# Patient Record
Sex: Female | Born: 1990 | Race: Black or African American | Hispanic: No | Marital: Single | Smoking: Never smoker
Health system: Southern US, Community
[De-identification: ages and names within clinical notes are randomized; demographics above are authoritative.]

## PROBLEM LIST (undated history)

## (undated) DIAGNOSIS — L309 Dermatitis, unspecified: Secondary | ICD-10-CM

## (undated) DIAGNOSIS — R87619 Unspecified abnormal cytological findings in specimens from cervix uteri: Secondary | ICD-10-CM

## (undated) DIAGNOSIS — A64 Unspecified sexually transmitted disease: Secondary | ICD-10-CM

## (undated) DIAGNOSIS — J45909 Unspecified asthma, uncomplicated: Secondary | ICD-10-CM

## (undated) DIAGNOSIS — Z8489 Family history of other specified conditions: Secondary | ICD-10-CM

## (undated) DIAGNOSIS — G43909 Migraine, unspecified, not intractable, without status migrainosus: Secondary | ICD-10-CM

## (undated) DIAGNOSIS — N83209 Unspecified ovarian cyst, unspecified side: Secondary | ICD-10-CM

## (undated) DIAGNOSIS — O1490 Unspecified pre-eclampsia, unspecified trimester: Secondary | ICD-10-CM

## (undated) HISTORY — DX: Unspecified pre-eclampsia, unspecified trimester: O14.90

## (undated) HISTORY — PX: NO PAST SURGERIES: SHX2092

## (undated) HISTORY — DX: Unspecified abnormal cytological findings in specimens from cervix uteri: R87.619

## (undated) HISTORY — DX: Unspecified sexually transmitted disease: A64

## (undated) HISTORY — DX: Migraine, unspecified, not intractable, without status migrainosus: G43.909

## (undated) HISTORY — DX: Family history of other specified conditions: Z84.89

---

## 2018-12-21 ENCOUNTER — Emergency Department (HOSPITAL_COMMUNITY): Payer: Commercial Managed Care - PPO

## 2018-12-21 ENCOUNTER — Emergency Department (HOSPITAL_COMMUNITY)
Admission: EM | Admit: 2018-12-21 | Discharge: 2018-12-21 | Disposition: A | Discharge status: Home / Self Care | Payer: Commercial Managed Care - PPO | Attending: Emergency Medicine | Admitting: Emergency Medicine

## 2018-12-21 ENCOUNTER — Other Ambulatory Visit: Payer: Self-pay

## 2018-12-21 ENCOUNTER — Encounter (HOSPITAL_COMMUNITY): Payer: Self-pay | Admitting: Emergency Medicine

## 2018-12-21 DIAGNOSIS — R1032 Left lower quadrant pain: Secondary | ICD-10-CM | POA: Diagnosis not present

## 2018-12-21 HISTORY — DX: Unspecified ovarian cyst, unspecified side: N83.209

## 2018-12-21 HISTORY — DX: Unspecified asthma, uncomplicated: J45.909

## 2018-12-21 LAB — URINALYSIS, ROUTINE W REFLEX MICROSCOPIC
Bilirubin Urine: NEGATIVE
Glucose, UA: NEGATIVE mg/dL
Ketones, ur: NEGATIVE mg/dL
Nitrite: NEGATIVE
Protein, ur: NEGATIVE mg/dL
Specific Gravity, Urine: 1.023 (ref 1.005–1.030)
pH: 7 (ref 5.0–8.0)

## 2018-12-21 LAB — BASIC METABOLIC PANEL
Anion gap: 9 (ref 5–15)
BUN: 8 mg/dL (ref 6–20)
CO2: 22 mmol/L (ref 22–32)
Calcium: 9.1 mg/dL (ref 8.9–10.3)
Chloride: 108 mmol/L (ref 98–111)
Creatinine, Ser: 0.9 mg/dL (ref 0.44–1.00)
GFR calc Af Amer: >60 mL/min (ref >60)
GFR calc non Af Amer: >60 mL/min (ref >60)
Glucose, Bld: 97 mg/dL (ref 70–99)
Potassium: 3.8 mmol/L (ref 3.5–5.1)
Sodium: 139 mmol/L (ref 135–145)

## 2018-12-21 LAB — CBC
HCT: 42.8 % (ref 36.0–46.0)
Hemoglobin: 13.2 g/dL (ref 12.0–15.0)
MCH: 27.4 pg (ref 26.0–34.0)
MCHC: 30.8 g/dL (ref 30.0–36.0)
MCV: 89 fL (ref 80.0–100.0)
Platelets: 322 10*3/uL (ref 150–400)
RBC: 4.81 MIL/uL (ref 3.87–5.11)
RDW: 12.6 % (ref 11.5–15.5)
WBC: 5.9 10*3/uL (ref 4.0–10.5)
nRBC: 0 % (ref 0.0–0.2)

## 2018-12-21 LAB — I-STAT BETA HCG BLOOD, ED (MC, WL, AP ONLY): I-stat hCG, quantitative: <5 m[IU]/mL (ref <5)

## 2018-12-21 MED ORDER — SULFAMETHOXAZOLE-TRIMETHOPRIM 800-160 MG PO TABS
1.0000 | ORAL_TABLET | Freq: Once | ORAL | Status: AC
  Administered 2018-12-21: 09:00:00 1 via ORAL
  Filled 2018-12-21: qty 1

## 2018-12-21 MED ORDER — SULFAMETHOXAZOLE-TRIMETHOPRIM 800-160 MG PO TABS: 1.0000 | ORAL_TABLET | Freq: Two times a day (BID) | ORAL | 0 refills | Status: AC

## 2018-12-21 NOTE — ED Triage Notes (Signed)
Pt reports history of ovarian cyst.  C/o L sided abd pain since Monday.  Denies nausea, vomiting, and diarrhea.

## 2018-12-21 NOTE — Discharge Instructions (Signed)
Thank you for allowing me to care for you today. Please return to the emergency department if you have new or worsening symptoms. Take your medications as instructed.  ° °

## 2018-12-21 NOTE — ED Provider Notes (Signed)
[facility] EMERGENCY DEPARTMENT Provider Note   CSN: [id number] Arrival date & time: 12/21/18  0541     History   Chief Complaint Chief Complaint  Patient presents with  . L sided abd pain    HPI Patty Coffey is a 28 y.o. female.     Patient is a 28 year old female with past medical history of ovarian cyst presents emergency department for left-sided abdominal pain.  Reports that this started on Monday.  Reports that on Monday it was severe 10 out of 10 but today has subsided.  Reports that her mother wanted to come and get checked out.  She has a history of a ovarian cyst on the right side.  Reports that she was afraid that it may have ruptured.  She is sexually active.  She denies any vaginal bleeding, vaginal discharge, nausea, vomiting, fever, diarrhea, chills, hematuria.  She does not have any concern for sexually transmitted infections.  She reports currently the pain is controlled.  No exacerbating or relieving factors.     Past Medical History:  Diagnosis Date  . Asthma   . Ovarian cyst     There are no active problems to display for this patient.   History reviewed. No pertinent surgical history.   OB History   No obstetric history on file.      Home Medications    Prior to Admission medications   Medication Sig Start Date End Date Taking? Authorizing Provider  sulfamethoxazole-trimethoprim (BACTRIM DS) 800-160 MG tablet Take 1 tablet by mouth 2 (two) times daily for 5 days. 12/21/18 12/26/18  Arlyn Dunning, PA-C    Family History No family history on file.  Social History Social History   Tobacco Use  . Smoking status: Never Smoker  . Smokeless tobacco: Never Used  Substance Use Topics  . Alcohol use: Yes  . Drug use: Never     Allergies   Toradol [ketorolac tromethamine]   Review of Systems Review of Systems  Constitutional: Negative for appetite change, chills and fever.  HENT: Negative for congestion.    Respiratory: Negative for cough and shortness of breath.   Cardiovascular: Negative for chest pain.  Gastrointestinal: Positive for abdominal pain. Negative for abdominal distention, anal bleeding, blood in stool, constipation, diarrhea, nausea and vomiting.  Genitourinary: Negative for difficulty urinating, dysuria, frequency, hematuria, menstrual problem, pelvic pain, urgency, vaginal bleeding and vaginal discharge.  Musculoskeletal: Negative for back pain.  Skin: Negative for rash.  Neurological: Negative for dizziness and headaches.     Physical Exam Updated Vital Signs BP 129/72 (BP Location: Right Arm)   Pulse 80   Temp 98.4 F (36.9 C) (Oral)   Resp 16   LMP 12/10/2018   SpO2 100%   Physical Exam Vitals signs and nursing note reviewed.  Constitutional:      General: She is not in acute distress.    Appearance: Normal appearance. She is well-developed.  HENT:     Head: Normocephalic and atraumatic.     Mouth/Throat:     Mouth: Mucous membranes are moist.  Eyes:     Conjunctiva/sclera: Conjunctivae normal.  Neck:     Musculoskeletal: Neck supple.  Cardiovascular:     Rate and Rhythm: Normal rate and regular rhythm.     Heart sounds: No murmur.  Pulmonary:     Effort: Pulmonary effort is normal. No respiratory distress.     Breath sounds: Normal breath sounds.  Abdominal:     General: Abdomen is flat.  Palpations: Abdomen is soft.     Tenderness: There is abdominal tenderness. There is no right CVA tenderness, left CVA tenderness, guarding or rebound. Negative signs include Murphy's sign and Rovsing's sign.     Hernia: No hernia is present.    Skin:    General: Skin is warm and dry.  Neurological:     Mental Status: She is alert.      ED Treatments / Results  Labs (all labs ordered are listed, but only abnormal results are displayed) Labs Reviewed  URINALYSIS, ROUTINE W REFLEX MICROSCOPIC - Abnormal; Notable for the following components:      Result  Value   APPearance HAZY (*)    Hgb urine dipstick SMALL (*)    Leukocytes,Ua TRACE (*)    Bacteria, UA FEW (*)    All other components within normal limits  URINE CULTURE  CBC  BASIC METABOLIC PANEL  I-STAT BETA HCG BLOOD, ED ([facility], [facility], [facility] ONLY)    EKG None  Radiology Ct Renal Stone Study  Result Date: 12/21/2018 CLINICAL DATA:  Hematuria.  Left abdominal pain. EXAM: CT ABDOMEN AND PELVIS WITHOUT CONTRAST TECHNIQUE: Multidetector CT imaging of the abdomen and pelvis was performed following the standard protocol without IV contrast. COMPARISON:  None. FINDINGS: Lower chest: No significant pulmonary nodules or acute consolidative airspace disease. Hepatobiliary: Normal liver size. No liver mass. Normal gallbladder with no radiopaque cholelithiasis. No biliary ductal dilatation. Pancreas: Normal, with no mass or duct dilation. Spleen: Normal size. No mass. Adrenals/Urinary Tract: Normal adrenals. No renal stones. No hydronephrosis. No contour deforming renal masses. Normal caliber ureters. No ureteral stones. Normal bladder with no bladder stones. Stomach/Bowel: Normal non-distended stomach. Normal caliber small bowel with no small bowel wall thickening. Normal appendix. Normal large bowel with no diverticulosis, large bowel wall thickening or pericolonic fat stranding. Vascular/Lymphatic: Normal caliber abdominal aorta. No pathologically enlarged lymph nodes in the abdomen or pelvis. Reproductive: Grossly normal uterus.  No adnexal mass. Other: No pneumoperitoneum, ascites or focal fluid collection. Musculoskeletal: No aggressive appearing focal osseous lesions. IMPRESSION: No acute abnormality. No urolithiasis. No hydronephrosis. No evidence of bowel obstruction or acute bowel inflammation. Electronically Signed   By: Ilona Sorrel M.D.   On: 12/21/2018 10:24    Procedures Procedures (including critical care time)  Medications Ordered in ED Medications  sulfamethoxazole-trimethoprim (BACTRIM  DS) 800-160 MG per tablet 1 tablet (1 tablet Oral Given 12/21/18 0837)     Initial Impression / Assessment and Plan / ED Course  I have reviewed the triage vital signs and the nursing notes.  Pertinent labs & imaging results that were available during my care of the patient were reviewed by me and considered in my medical decision making (see chart for details).  Clinical Course as of Dec 21 1026  Wed Dec 21, 2018  1610 Patient with improving left side abdominal pain since Monday. She is concerned because she has an ovarian cyst on that side. No pelvic or vaginal complaints, no peritoneal signs on exam.  The location of her pain on my exam is not consistent with an ovarian cyst.  She does have some hemoglobin and red blood cells in her urine.  For this reason and for the location of her pain I am going to get a CT renal study to rule out a stone.  Otherwise she is afebrile, normal vitals, pain controlled, reassuring labs.   [KM]  1028 Pain is controlled, work-up negative.  Plan to discharge patient on Bactrim for UTI.  Discussed return precautions.  Discussed plan with Dr. Stevie Kernykstra and plan agreed upon.   [KM]    Clinical Course User Index [KM] Arlyn DunningMcLean, Laurell Coalson A, PA-C       Based on review of vitals, medical screening exam, lab work and/or imaging, there does not appear to be an acute, emergent etiology for the patient's symptoms. Counseled pt on good return precautions and encouraged both PCP and ED follow-up as needed.  Prior to discharge, I also discussed incidental imaging findings with patient in detail and advised appropriate, recommended follow-up in detail.  Clinical Impression: 1. Left lower quadrant abdominal pain     Disposition: Discharge  Prior to providing a prescription for a controlled substance, I independently reviewed the patient's recent prescription history on the [place] Controlled Substance Reporting System. The patient had no recent or regular prescriptions  and was deemed appropriate for a brief, less than 3 day prescription of narcotic for acute analgesia.  This note was prepared with assistance of Conservation officer, historic buildingsDragon voice recognition software. Occasional wrong-word or sound-a-like substitutions may have occurred due to the inherent limitations of voice recognition software.   Final Clinical Impressions(s) / ED Diagnoses   Final diagnoses:  Left lower quadrant abdominal pain    ED Discharge Orders         Ordered    sulfamethoxazole-trimethoprim (BACTRIM DS) 800-160 MG tablet  2 times daily     12/21/18 1027           Jeral PinchMcLean, Myishia Kasik A, PA-C 12/21/18 1029    Milagros Lollykstra, Richard S, MD 12/21/18 1737

## 2018-12-21 NOTE — ED Notes (Signed)
Spoke with Antonietta Breach, PA regarding lab orders.  Orders received.

## 2018-12-23 LAB — URINE CULTURE: Culture: >=100000 — AB

## 2019-01-27 ENCOUNTER — Encounter: Payer: Self-pay | Admitting: Certified Nurse Midwife

## 2019-02-17 ENCOUNTER — Other Ambulatory Visit (HOSPITAL_COMMUNITY)
Admission: RE | Admit: 2019-02-17 | Discharge: 2019-02-17 | Disposition: A | Discharge status: Home / Self Care | Payer: Commercial Managed Care - PPO | Source: Ambulatory Visit | Attending: Obstetrics & Gynecology | Admitting: Obstetrics & Gynecology

## 2019-02-17 ENCOUNTER — Ambulatory Visit: Payer: Commercial Managed Care - PPO | Admitting: Certified Nurse Midwife

## 2019-02-17 ENCOUNTER — Other Ambulatory Visit: Payer: Self-pay

## 2019-02-17 ENCOUNTER — Encounter: Payer: Self-pay | Admitting: Certified Nurse Midwife

## 2019-02-17 VITALS — BP 120/78 | HR 70 | Temp 97.1°F | Resp 16 | Ht 65.75 in | Wt 229.0 lb

## 2019-02-17 DIAGNOSIS — Z124 Encounter for screening for malignant neoplasm of cervix: Secondary | ICD-10-CM | POA: Insufficient documentation

## 2019-02-17 DIAGNOSIS — Z113 Encounter for screening for infections with a predominantly sexual mode of transmission: Secondary | ICD-10-CM

## 2019-02-17 DIAGNOSIS — Z01419 Encounter for gynecological examination (general) (routine) without abnormal findings: Secondary | ICD-10-CM | POA: Diagnosis not present

## 2019-02-17 NOTE — Progress Notes (Signed)
28 y.o. G1P0010 Single  African American Fe here for annual exam. Periods monthly, duration 4 days, day 2-3 is heavy with cramping. Uses heating pad and Advil use with good results. Desires STD screening, partner change in the last 3 months. Contraception use of Nexplanon 6 years. Condom use sporadic. 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 No other health issues today.   Patient's last menstrual period was 01/26/2019 (exact date).          Sexually active: Yes.    The current method of family planning is none.  Exercising: Yes.    at work Smoker:  no  Review of Systems  Constitutional: Negative.   HENT: Negative.   Eyes: Negative.   Respiratory: Negative.   Cardiovascular: Negative.   Gastrointestinal: Negative.   Genitourinary: Negative.   Musculoskeletal: Negative.   Skin: Negative.   Neurological: Negative.   Endo/Heme/Allergies: Negative.   Psychiatric/Behavioral: Negative.     Health Maintenance: Pap:  2019 neg per patient History of Abnormal Pap: age 59 MMG:  none Self Breast exams: yes Colonoscopy:  none BMD:   none TDaP:  unsure Shingles: no Pneumonia: not done Hep C and HIV: neg per patient  Labs: yes   reports that she has never smoked. She has never used smokeless tobacco. She reports previous alcohol use. She reports that she does not use drugs.  Past Medical History:  Diagnosis Date  . Abnormal Pap smear of cervix   . Asthma   . Migraines   . Ovarian cyst   . Ovarian cyst    left side    No past surgical history on file.  No current outpatient medications on file.   No current facility-administered medications for this visit.     Family History  Problem Relation Age of Onset  . Bone cancer Mother   . Diabetes Mother   . Ovarian cancer Maternal Aunt   . Diabetes Maternal Aunt   . Hypertension Maternal Aunt   . Stroke Maternal Grandmother   . Diabetes Maternal Grandmother   . Hypertension Maternal Grandmother   . Heart attack  Maternal Grandmother   . Breast cancer Maternal Aunt   . Diabetes Maternal Aunt   . Hypertension Maternal Aunt   . Stroke Maternal Aunt   . Breast cancer Maternal Aunt   . Hypertension Maternal Aunt   . Stroke Maternal Aunt     ROS:  Pertinent items are noted in HPI.  Otherwise, a comprehensive ROS was negative.  Exam:   BP 120/78   Pulse 70   Temp (!) 97.1 F (36.2 C) (Skin)   Resp 16   Ht 5' 5.75" (1.67 m)   Wt 229 lb (103.9 kg)   LMP 01/26/2019 (Exact Date)   BMI 37.24 kg/m  Height: 5' 5.75" (167 cm) Ht Readings from Last 3 Encounters:  02/17/19 5' 5.75" (1.67 m)    General appearance: alert, cooperative and appears stated age Head: Normocephalic, without obvious abnormality, atraumatic Neck: no adenopathy, supple, symmetrical, trachea midline and thyroid normal to inspection and palpation Lungs: clear to auscultation bilaterally Breasts: normal appearance, no masses or tenderness, No nipple retraction or dimpling, No nipple discharge or bleeding, No axillary or supraclavicular adenopathy Heart: regular rate and rhythm Abdomen: soft, non-tender; no masses,  no organomegaly Extremities: extremities normal, atraumatic, no cyanosis or edema Skin: Skin color, texture, turgor normal. No rashes or lesions Lymph nodes: Cervical, supraclavicular, and axillary nodes normal. No abnormal inguinal nodes palpated Neurologic: Grossly normal  Pelvic: External genitalia:  no lesions              Urethra:  normal appearing urethra with no masses, tenderness or lesions              Bartholin's and Skene's: normal                 Vagina: normal appearing vagina with normal color and discharge, no lesions              Cervix: no cervical motion tenderness, no lesions and nulliparous appearance              Pap taken: Yes.   Bimanual Exam:  Uterus:  normal size, contour, position, consistency, mobility, non-tender              Adnexa: normal adnexa and no mass, fullness, tenderness                Rectovaginal: Confirms               Anus:  normal appearance no lesions  Chaperone present: yes  A:  Well Woman with normal exam  Contraception condoms previous Nexplanon   STD screening/screening labs   P:   Reviewed health and wellness pertinent to exam  Discussed importance of contraception, desires none at this point. Has used condoms also.  Labs: HIV RPR, HEP C, affirm, GC/Chlamydia  Pap smear:    counseled on breast self exam, STD prevention, HIV risk factors and prevention, feminine hygiene, family planning choices, adequate intake of calcium and vitamin D, diet and exercise  return annually or prn  An After Visit Summary was printed and given to the patient.

## 2019-02-17 NOTE — Patient Instructions (Signed)
General topics  Next pap or exam is  due in 1 year Take a Women's multivitamin Take 1200 mg. of calcium daily - prefer dietary If any concerns in interim to call back  Breast Self-Awareness Practicing breast self-awareness may pick up problems early, prevent significant medical complications, and possibly save your life. By practicing breast self-awareness, you can become familiar with how your breasts look and feel and if your breasts are changing. This allows you to notice changes early. It can also offer you some reassurance that your breast health is good. One way to learn what is normal for your breasts and whether your breasts are changing is to do a breast self-exam. If you find a lump or something that was not present in the past, it is best to contact your caregiver right away. Other findings that should be evaluated by your caregiver include nipple discharge, especially if it is bloody; skin changes or reddening; areas where the skin seems to be pulled in (retracted); or new lumps and bumps. Breast pain is seldom associated with cancer (malignancy), but should also be evaluated by a caregiver. BREAST SELF-EXAM The best time to examine your breasts is 5 7 days after your menstrual period is over.  ExitCare Patient Information 2013 [place].   Exercise to Stay Healthy Exercise helps you become and stay healthy. EXERCISE IDEAS AND TIPS Choose exercises that:  You enjoy.  Fit into your day. You do not need to exercise really hard to be healthy. You can do exercises at a slow or medium level and stay healthy. You can:  Stretch before and after working out.  Try yoga, Pilates, or tai chi.  Lift weights.  Walk fast, swim, jog, run, climb stairs, bicycle, dance, or rollerskate.  Take aerobic classes. Exercises that burn about 150 calories:  Running 1  miles in 15 minutes.  Playing volleyball for 45 to 60 minutes.  Washing and waxing a car for 45 to 60  minutes.  Playing touch football for 45 minutes.  Walking 1  miles in 35 minutes.  Pushing a stroller 1  miles in 30 minutes.  Playing basketball for 30 minutes.  Raking leaves for 30 minutes.  Bicycling 5 miles in 30 minutes.  Walking 2 miles in 30 minutes.  Dancing for 30 minutes.  Shoveling snow for 15 minutes.  Swimming laps for 20 minutes.  Walking up stairs for 15 minutes.  Bicycling 4 miles in 15 minutes.  Gardening for 30 to 45 minutes.  Jumping rope for 15 minutes.  Washing windows or floors for 45 to 60 minutes. Document Released: 04/04/2010 Document Revised: 05/25/2011 Document Reviewed: 04/04/2010 [facility] Patient Information 2013 [place].   Other topics ( that may be useful information):    Sexually Transmitted Disease Sexually transmitted disease (STD) refers to any infection that is passed from person to person during sexual activity. This may happen by way of saliva, semen, blood, vaginal mucus, or urine. Common STDs include:  Gonorrhea.  Chlamydia.  Syphilis.  HIV/AIDS.  Genital herpes.  Hepatitis B and C.  Trichomonas.  Human papillomavirus (HPV).  Pubic lice. CAUSES  An STD may be spread by bacteria, virus, or parasite. A person can get an STD by:  Sexual intercourse with an infected person.  Sharing sex toys with an infected person.  Sharing needles with an infected person.  Having intimate contact with the genitals, mouth, or rectal areas of an infected person. SYMPTOMS  Some people may  they can still pass the infection to others. Different STDs have different symptoms. Symptoms include: °· Painful or bloody urination. °· Pain in the pelvis, abdomen, vagina, anus, throat, or eyes. °· Skin rash, itching, irritation, growths, or sores (lesions). These usually occur in the genital or anal area. °· Abnormal vaginal discharge. °· Penile discharge in men. °· Soft, flesh-colored skin growths in the  genital or anal area. °· Fever. °· Pain or bleeding during sexual intercourse. °· Swollen glands in the groin area. °· Yellow skin and eyes (jaundice). This is seen with hepatitis. °DIAGNOSIS  °To make a diagnosis, your caregiver may: °· Take a medical history. °· Perform a physical exam. °· Take a specimen (culture) to be examined. °· Examine a sample of discharge under a microscope. °· Perform blood test °TREATMENT  °· Chlamydia, gonorrhea, trichomonas, and syphilis can be cured with antibiotic medicine. °· Genital herpes, hepatitis, and HIV can be treated, but not cured, with prescribed medicines. The medicines will lessen the symptoms. °· Genital warts from HPV can be treated with medicine or by freezing, burning (electrocautery), or surgery. Warts may come back. °· HPV is a virus and cannot be cured with medicine or surgery. However, abnormal areas may be followed very closely by your caregiver and may be removed from the cervix, vagina, or vulva through office procedures or surgery. °If your diagnosis is confirmed, your recent sexual partners need treatment. This is true even if they are symptom-free or have a negative culture or evaluation. They should not have sex until their caregiver says it is okay. °HOME CARE INSTRUCTIONS °· All sexual partners should be informed, tested, and treated for all STDs. °· Take your antibiotics as directed. Finish them even if you start to feel better. °· Only take over-the-counter or prescription medicines for pain, discomfort, or fever as directed by your caregiver. °· Rest. °· Eat a balanced diet and drink enough fluids to keep your urine clear or pale yellow. °· Do not have sex until treatment is completed and you have followed up with your caregiver. STDs should be checked after treatment. °· Keep all follow-up appointments, Pap tests, and blood tests as directed by your caregiver. °· Only use latex condoms and water-soluble lubricants during sexual activity. Do not use  petroleum jelly or oils. °· Avoid alcohol and illegal drugs. °· Get vaccinated for HPV and hepatitis. If you have not received these vaccines in the past, talk to your caregiver about whether one or both might be right for you. °· Avoid risky sex practices that can break the skin. °The only way to avoid getting an STD is to avoid all sexual activity. Latex condoms and dental dams (for oral sex) will help lessen the risk of getting an STD, but will not completely eliminate the risk. °SEEK MEDICAL CARE IF:  °· You have a fever. °· You have any new or worsening symptoms. °Document Released: 05/23/2002 Document Revised: 05/25/2011 Document Reviewed: 05/30/2010 °ExitCare® Patient Information ©2013 [employer]. ° ° ° °Domestic Abuse °You are being battered or abused if someone close to you hits, pushes, or physically hurts you in any way. You also are being abused if you are forced into activities. You are being sexually abused if you are forced to have sexual contact of any kind. You are being emotionally abused if you are made to feel worthless or if you are constantly threatened. It is important to remember that help is available. No one has the right to abuse you. °PREVENTION OF FURTHER   abuse you. PREVENTION OF FURTHER ABUSE  Learn the warning signs of danger. This varies with situations but may include: the use of alcohol, threats, isolation from friends and family, or forced sexual contact. Leave if you feel that violence is going to occur.  If you are attacked or beaten, report it to the police so the abuse is documented. You do not have to press charges. The police can protect you while you or the attackers are leaving. Get the officer's name and badge number and a copy of the report.  Find someone you can trust and tell them what is happening to you: your caregiver, a nurse, clergy member, close friend or family member. Feeling ashamed is natural, but remember that you have done nothing wrong. No one deserves abuse. Document Released:  02/28/2000 Document Revised: 05/25/2011 Document Reviewed: 05/08/2010 [facility] Patient Information 2013 [place].    How Much is Too Much Alcohol? Drinking too much alcohol can cause injury, accidents, and health problems. These types of problems can include:   Car crashes.  Falls.  Family fighting (domestic violence).  Drowning.  Fights.  Injuries.  Burns.  Damage to certain organs.  Having a baby with birth defects. ONE DRINK CAN BE TOO MUCH WHEN YOU ARE:  Working.  Pregnant or breastfeeding.  Taking medicines. Ask your doctor.  Driving or planning to drive. If you or someone you know has a drinking problem, get help from a doctor.  Document Released: 12/27/2008 Document Revised: 05/25/2011 Document Reviewed: 12/27/2008 [facility] Patient Information 2013 [place].   Smoking Hazards Smoking cigarettes is extremely bad for your health. Tobacco smoke has over 200 known poisons in it. There are over 60 chemicals in tobacco smoke that cause cancer. Some of the chemicals found in cigarette smoke include:   Cyanide.  Benzene.  Formaldehyde.  Methanol (wood alcohol).  Acetylene (fuel used in welding torches).  Ammonia. Cigarette smoke also contains the poisonous gases nitrogen oxide and carbon monoxide.  Cigarette smokers have an increased risk of many serious medical problems and Smoking causes approximately:  90% of all lung cancer deaths in men.  80% of all lung cancer deaths in women.  90% of deaths from chronic obstructive lung disease. Compared with nonsmokers, smoking increases the risk of:  Coronary heart disease by 2 to 4 times.  Stroke by 2 to 4 times.  Men developing lung cancer by 23 times.  Women developing lung cancer by 13 times.  Dying from chronic obstructive lung diseases by 12 times.  . Smoking is the most preventable cause of death and disease in our society.  WHY IS SMOKING ADDICTIVE?  Nicotine is the chemical  agent in tobacco that is capable of causing addiction or dependence.  When you smoke and inhale, nicotine is absorbed rapidly into the bloodstream through your lungs. Nicotine absorbed through the lungs is capable of creating a powerful addiction. Both inhaled and non-inhaled nicotine may be addictive.  Addiction studies of cigarettes and spit tobacco show that addiction to nicotine occurs mainly during the teen years, when young people begin using tobacco products. WHAT ARE THE BENEFITS OF QUITTING?  There are many health benefits to quitting smoking.   Likelihood of developing cancer and heart disease decreases. Health improvements are seen almost immediately.  Blood pressure, pulse rate, and breathing patterns start returning to normal soon after quitting. QUITTING SMOKING   American Lung Association - 1-800-LUNGUSA  American Cancer Society - 1-800-ACS-2345 Document Released: 04/09/2004 Document Revised: 05/25/2011 Document Reviewed: 12/12/2008  ExitCare Patient Information 2013 [place].   Stress Management Stress is a state of physical or mental tension that often results from changes in your life or normal routine. Some common causes of stress are:  Death of a loved one.  Injuries or severe illnesses.  Getting fired or changing jobs.  Moving into a new home. Other causes may be:  Sexual problems.  Business or financial losses.  Taking on a large debt.  Regular conflict with someone at home or at work.  Constant tiredness from lack of sleep. It is not just bad things that are stressful. It may be stressful to:  Win the lottery.  Get married.  Buy a new car. The amount of stress that can be easily tolerated varies from person to person. Changes generally cause stress, regardless of the types of change. Too much stress can affect your health. It may lead to physical or emotional problems. Too little stress (boredom) may also become stressful. SUGGESTIONS TO  REDUCE STRESS:  Talk things over with your family and friends. It often is helpful to share your concerns and worries. If you feel your problem is serious, you may want to get help from a professional counselor.  Consider your problems one at a time instead of lumping them all together. Trying to take care of everything at once may seem impossible. List all the things you need to do and then start with the most important one. Set a goal to accomplish 2 or 3 things each day. If you expect to do too many in a single day you will naturally fail, causing you to feel even more stressed.  Do not use alcohol or drugs to relieve stress. Although you may feel better for a short time, they do not remove the problems that caused the stress. They can also be habit forming.  Exercise regularly - at least 3 times per week. Physical exercise can help to relieve that "uptight" feeling and will relax you.  The shortest distance between despair and hope is often a good night's sleep.  Go to bed and get up on time allowing yourself time for appointments without being rushed.  Take a short "time-out" period from any stressful situation that occurs during the day. Close your eyes and take some deep breaths. Starting with the muscles in your face, tense them, hold it for a few seconds, then relax. Repeat this with the muscles in your neck, shoulders, hand, stomach, back and legs.  Take good care of yourself. Eat a balanced diet and get plenty of rest.  Schedule time for having fun. Take a break from your daily routine to relax. HOME CARE INSTRUCTIONS   Call if you feel overwhelmed by your problems and feel you can no longer manage them on your own.  Return immediately if you feel like hurting yourself or someone else. Document Released: 08/26/2000 Document Revised: 05/25/2011 Document Reviewed: 04/18/2007 [facility] Patient Information 2013 [place].

## 2019-02-18 LAB — VAGINITIS/VAGINOSIS, DNA PROBE
Candida Species: NEGATIVE
Gardnerella vaginalis: POSITIVE — AB
Trichomonas vaginosis: NEGATIVE

## 2019-02-18 LAB — HIV ANTIBODY (ROUTINE TESTING W REFLEX): HIV Screen 4th Generation wRfx: NONREACTIVE

## 2019-02-18 LAB — HEPATITIS C ANTIBODY: Hep C Virus Ab: <0.1 s/co ratio (ref 0.0–0.9)

## 2019-02-18 LAB — RPR: RPR Ser Ql: NONREACTIVE

## 2019-02-20 ENCOUNTER — Other Ambulatory Visit: Payer: Self-pay

## 2019-02-20 MED ORDER — METRONIDAZOLE 500 MG PO TABS: 500.0000 mg | ORAL_TABLET | Freq: Two times a day (BID) | ORAL | 0 refills | Status: DC

## 2019-02-21 LAB — CYTOLOGY - PAP
Chlamydia: NEGATIVE
Comment: NEGATIVE
Comment: NORMAL
Diagnosis: NEGATIVE
Neisseria Gonorrhea: NEGATIVE

## 2019-05-29 ENCOUNTER — Encounter: Payer: Self-pay | Admitting: Certified Nurse Midwife

## 2019-05-31 ENCOUNTER — Encounter: Payer: Self-pay | Admitting: Certified Nurse Midwife

## 2020-02-20 ENCOUNTER — Ambulatory Visit: Payer: Commercial Managed Care - PPO | Admitting: Certified Nurse Midwife

## 2020-02-20 NOTE — Progress Notes (Unsigned)
29 y.o. G8P0010 Single Black or [place] American female here for annual exam.   Period Pattern: Regular Menstrual Flow: Moderate Menstrual Control: Maxi pad Dysmenorrhea: (!) Moderate Dysmenorrhea Symptoms: Cramping   Reports concern of breast pain, nausea and sensitivities to smells (bleach). This started before her period, 11/28, lasted for 4 days before period came on and is still going on. Mostly improving now, just gets nauseous with smells.  Patient's last menstrual period was 02/15/2020 (exact date).   Not interested in birth control.  Recently diagnosed with trichomonas at an urgent care 11/28 Gonorrhea and Chlamydia negative. Everything feels normal now.           Sexually active: Yes.    The current method of family planning is condoms sometimes.    Exercising: Yes.    some walking Smoker:  no  Health Maintenance: Pap:  02-17-2019 neg History of abnormal Pap:  Age 22 MMG:  none Colonoscopy:  none BMD:   none TDaP:  2021 Gardasil:   completed Covid-19: moderna Pneumonia vaccine(s):  no Shingrix:   N/a  Hep C testing: neg 2020 Screening Labs: n/a   reports that she has never smoked. She has never used smokeless tobacco. She reports previous alcohol use. She reports that she does not use drugs.  Past Medical History:  Diagnosis Date  . Abnormal Pap smear of cervix   . Asthma   . Migraines   . Ovarian cyst   . Ovarian cyst    left side  . STD (sexually transmitted disease)    trichomonas treated    History reviewed. No pertinent surgical history.  No current outpatient medications on file.   No current facility-administered medications for this visit.    Family History  Problem Relation Age of Onset  . Bone cancer Mother   . Diabetes Mother   . Ovarian cancer Maternal Aunt   . Diabetes Maternal Aunt   . Hypertension Maternal Aunt   . Stroke Maternal Grandmother   . Diabetes Maternal Grandmother   . Hypertension Maternal Grandmother   . Heart attack  Maternal Grandmother   . Breast cancer Maternal Aunt   . Diabetes Maternal Aunt   . Hypertension Maternal Aunt   . Stroke Maternal Aunt   . Breast cancer Maternal Aunt   . Hypertension Maternal Aunt   . Stroke Maternal Aunt     Review of Systems  Exam:   Ht 5\' 6"  (1.676 m) Comment: hairstyle  Wt 218 lb (98.9 kg)   LMP 02/15/2020 (Exact Date)   BMI 35.19 kg/m   Height: 5\' 6"  (167.6 cm) (hairstyle)  General appearance: alert, cooperative and appears stated age Head: Normocephalic, without obvious abnormality, atraumatic Neck: no adenopathy, supple, symmetrical, trachea midline and thyroid normal to inspection and palpation Lungs: clear to auscultation bilaterally Breasts: normal appearance, no masses or tenderness Heart: regular rate and rhythm Abdomen: soft, non-tender; bowel sounds normal; no masses,  no organomegaly Extremities: extremities normal, atraumatic, no cyanosis or edema Skin: Skin color, texture, turgor normal. No rashes or lesions Lymph nodes: Cervical, supraclavicular, and axillary nodes normal. No abnormal inguinal nodes palpated Neurologic: Grossly normal   Pelvic: External genitalia:  no lesions              Urethra:  normal appearing urethra with no masses, tenderness or lesions              Bartholins and Skenes: normal  Vagina: normal appearing vagina with moderate to large amount white discharge, no lesions              Cervix: no cervical motion tenderness and no lesions              Pap taken: No. Bimanual Exam:  Uterus:  normal size, contour, position, consistency, mobility, non-tender              Adnexa: Right adnexa palpates slightly larger than left, non tender               Rectovaginal: Confirms               Anus:  normal sphincter tone, no lesions   Marchelle Folks, CMA Chaperone was present for exam.  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

## 2020-02-22 ENCOUNTER — Encounter: Payer: Self-pay | Admitting: Nurse Practitioner

## 2020-02-22 ENCOUNTER — Other Ambulatory Visit: Payer: Self-pay

## 2020-02-22 ENCOUNTER — Ambulatory Visit (INDEPENDENT_AMBULATORY_CARE_PROVIDER_SITE_OTHER): Payer: Commercial Managed Care - PPO | Admitting: Nurse Practitioner

## 2020-02-22 VITALS — BP 110/70 | HR 68 | Resp 16 | Ht 66.0 in | Wt 218.0 lb

## 2020-02-22 DIAGNOSIS — Z113 Encounter for screening for infections with a predominantly sexual mode of transmission: Secondary | ICD-10-CM | POA: Diagnosis not present

## 2020-02-22 DIAGNOSIS — N644 Mastodynia: Secondary | ICD-10-CM

## 2020-02-22 DIAGNOSIS — Z7721 Contact with and (suspected) exposure to potentially hazardous body fluids: Secondary | ICD-10-CM

## 2020-02-22 DIAGNOSIS — Z01419 Encounter for gynecological examination (general) (routine) without abnormal findings: Secondary | ICD-10-CM | POA: Diagnosis not present

## 2020-02-22 LAB — POCT URINE PREGNANCY: Preg Test, Ur: NEGATIVE

## 2020-02-22 NOTE — Patient Instructions (Signed)
Health Maintenance, Female Adopting a healthy lifestyle and getting preventive care are important in promoting health and wellness. Ask your health care provider about:  The right schedule for you to have regular tests and exams.  Things you can do on your own to prevent diseases and keep yourself healthy. What should I know about diet, weight, and exercise? Eat a healthy diet   Eat a diet that includes plenty of vegetables, fruits, low-fat dairy products, and lean protein.  Do not eat a lot of foods that are high in solid fats, added sugars, or sodium. Maintain a healthy weight Body mass index (BMI) is used to identify weight problems. It estimates body fat based on height and weight. Your health care provider can help determine your BMI and help you achieve or maintain a healthy weight. Get regular exercise Get regular exercise. This is one of the most important things you can do for your health. Most adults should:  Exercise for at least 150 minutes each week. The exercise should increase your heart rate and make you sweat (moderate-intensity exercise).  Do strengthening exercises at least twice a week. This is in addition to the moderate-intensity exercise.  Spend less time sitting. Even light physical activity can be beneficial. Watch cholesterol and blood lipids Have your blood tested for lipids and cholesterol at 28 years of age, then have this test every 5 years. Have your cholesterol levels checked more often if:  Your lipid or cholesterol levels are high.  You are older than 28 years of age.  You are at high risk for heart disease. What should I know about cancer screening? Depending on your health history and family history, you may need to have cancer screening at various ages. This may include screening for:  Breast cancer.  Cervical cancer.  Colorectal cancer.  Skin cancer.  Lung cancer. What should I know about heart disease, diabetes, and high blood  pressure? Blood pressure and heart disease  High blood pressure causes heart disease and increases the risk of stroke. This is more likely to develop in people who have high blood pressure readings, are of African descent, or are overweight.  Have your blood pressure checked: ? Every 3-5 years if you are 18-39 years of age. ? Every year if you are 40 years old or older. Diabetes Have regular diabetes screenings. This checks your fasting blood sugar level. Have the screening done:  Once every three years after age 40 if you are at a normal weight and have a low risk for diabetes.  More often and at a younger age if you are overweight or have a high risk for diabetes. What should I know about preventing infection? Hepatitis B If you have a higher risk for hepatitis B, you should be screened for this virus. Talk with your health care provider to find out if you are at risk for hepatitis B infection. Hepatitis C Testing is recommended for:  Everyone born from 1945 through 1965.  Anyone with known risk factors for hepatitis C. Sexually transmitted infections (STIs)  Get screened for STIs, including gonorrhea and chlamydia, if: ? You are sexually active and are younger than 28 years of age. ? You are older than 28 years of age and your health care provider tells you that you are at risk for this type of infection. ? Your sexual activity has changed since you were last screened, and you are at increased risk for chlamydia or gonorrhea. Ask your health care provider if   you are at risk.  Ask your health care provider about whether you are at high risk for HIV. Your health care provider may recommend a prescription medicine to help prevent HIV infection. If you choose to take medicine to prevent HIV, you should first get tested for HIV. You should then be tested every 3 months for as long as you are taking the medicine. Pregnancy  If you are about to stop having your period (premenopausal) and  you may become pregnant, seek counseling before you get pregnant.  Take 400 to 800 micrograms (mcg) of folic acid every day if you become pregnant.  Ask for birth control (contraception) if you want to prevent pregnancy. Osteoporosis and menopause Osteoporosis is a disease in which the bones lose minerals and strength with aging. This can result in bone fractures. If you are 67 years old or older, or if you are at risk for osteoporosis and fractures, ask your health care provider if you should:  Be screened for bone loss.  Take a calcium or vitamin D supplement to lower your risk of fractures.  Be given hormone replacement therapy (HRT) to treat symptoms of menopause. Follow these instructions at home: Lifestyle  Do not use any products that contain nicotine or tobacco, such as cigarettes, e-cigarettes, and chewing tobacco. If you need help quitting, ask your health care provider.  Do not use street drugs.  Do not share needles.  Ask your health care provider for help if you need support or information about quitting drugs. Alcohol use  Do not drink alcohol if: ? Your health care provider tells you not to drink. ? You are pregnant, may be pregnant, or are planning to become pregnant.  If you drink alcohol: ? Limit how much you use to 0-1 drink a day. ? Limit intake if you are breastfeeding.  Be aware of how much alcohol is in your drink. In the [place] one drink equals one 12 oz bottle of beer (355 mL), one 5 oz glass of wine (148 mL), or one 1 oz glass of hard liquor (44 mL). General instructions  Schedule regular health, dental, and eye exams.  Stay current with your vaccines.  Tell your health care provider if: ? You often feel depressed. ? You have ever been abused or do not feel safe at home. Summary  Adopting a healthy lifestyle and getting preventive care are important in promoting health and wellness.  Follow your health care provider's instructions about healthy  diet, exercising, and getting tested or screened for diseases.  Follow your health care provider's instructions on monitoring your cholesterol and blood pressure. This information is not intended to replace advice given to you by your health care provider. Make sure you discuss any questions you have with your health care provider. Document Revised: 02/23/2018 Document Reviewed: 02/23/2018 Elsevier Patient Education  2020 [employer].   Breast Tenderness Breast tenderness is a common problem for women of all ages, but may also occur in men. Breast tenderness may range from mild discomfort to severe pain. In women, the pain usually comes and goes with the menstrual cycle, but it can also be constant. Breast tenderness has many possible causes, including hormone changes, infections, and taking certain medicines. You may have tests, such as a mammogram or an ultrasound, to check for any unusual findings. Having breast tenderness usually does not mean that you have breast cancer. Follow these instructions at home: Managing pain and discomfort   If directed, put ice to the  the painful area. To do this: ? Put ice in a plastic bag. ? Place a towel between your skin and the bag. ? Leave the ice on for 20 minutes, 2-3 times a day.  Wear a supportive bra, especially during exercise. You may also want to wear a supportive bra while sleeping if your breasts are very tender. Medicines  Take over-the-counter and prescription medicines only as told by your health care provider. If the cause of your pain is infection, you may be prescribed an antibiotic medicine.  If you were prescribed an antibiotic, take it as told by your health care provider. Do not stop taking the antibiotic even if you start to feel better. Eating and drinking  Your health care provider may recommend that you lessen the amount of fat in your diet. You can do this by: ? Limiting fried foods. ? Cooking foods using methods such as  baking, boiling, grilling, and broiling.  Decrease the amount of caffeine in your diet. Instead, drink more water and choose caffeine-free drinks. General instructions   Keep a log of the days and times when your breasts are most tender.  Ask your health care provider how to do breast exams at home. This will help you notice if you have an unusual growth or lump.  Keep all follow-up visits as told by your health care provider. This is important. Contact a health care provider if:  Any part of your breast is hard, red, and hot to the touch. This may be a sign of infection.  You are a woman and: ? Not breastfeeding and you have fluid, especially blood or pus, coming out of your nipples. ? Have a new or painful lump in your breast that remains after your menstrual period ends.  You have a fever.  Your pain does not improve or it gets worse.  Your pain is interfering with your daily activities. Summary  Breast tenderness may range from mild discomfort to severe pain.  Breast tenderness has many possible causes, including hormone changes, infections, and taking certain medicines.  It can be treated with ice, wearing a supportive bra, and medicines.  Make changes to your diet if told to by your health care provider. This information is not intended to replace advice given to you by your health care provider. Make sure you discuss any questions you have with your health care provider. Document Revised: 07/25/2018 Document Reviewed: 07/25/2018 Elsevier Patient Education  2020 Elsevier Inc.  

## 2020-02-23 LAB — HEPATITIS C ANTIBODY: Hep C Virus Ab: <0.1 s/co ratio (ref 0.0–0.9)

## 2020-02-23 LAB — HIV ANTIBODY (ROUTINE TESTING W REFLEX): HIV Screen 4th Generation wRfx: NONREACTIVE

## 2020-02-23 LAB — RPR: RPR Ser Ql: NONREACTIVE

## 2020-02-24 LAB — VAGINITIS/VAGINOSIS, DNA PROBE
Candida Species: NEGATIVE
Gardnerella vaginalis: POSITIVE — AB
Trichomonas vaginosis: NEGATIVE

## 2020-02-26 ENCOUNTER — Other Ambulatory Visit: Payer: Self-pay | Admitting: Nurse Practitioner

## 2020-02-26 DIAGNOSIS — N760 Acute vaginitis: Secondary | ICD-10-CM

## 2020-02-26 MED ORDER — METRONIDAZOLE 500 MG PO TABS: 500.0000 mg | ORAL_TABLET | Freq: Two times a day (BID) | ORAL | 0 refills | Status: DC

## 2020-04-22 NOTE — Progress Notes (Signed)
GYNECOLOGY  VISIT  CC:   Re-screen STD and recheck pelvic exam  HPI: 30 y.o. G1P0010 Single Black or African American female here for std testing & pelvic exam.   Has a 23 day cycle. Having unprotected sex Having nipple tenderness, frequent urination Feels she is late on her cycle  GYNECOLOGIC HISTORY: Patient's last menstrual period was 04/01/2020 (exact date). Contraception: none Menopausal hormone therapy: none  There are no problems to display for this patient.   Past Medical History:  Diagnosis Date  . Abnormal Pap smear of cervix   . Asthma   . Migraines   . Ovarian cyst   . Ovarian cyst    left side  . STD (sexually transmitted disease)    trichomonas treated    History reviewed. No pertinent surgical history.  MEDS:   No current outpatient medications on file prior to visit.   No current facility-administered medications on file prior to visit.    ALLERGIES: Toradol [ketorolac tromethamine]  Family History  Problem Relation Age of Onset  . Bone cancer Mother   . Diabetes Mother   . Ovarian cancer Maternal Aunt   . Diabetes Maternal Aunt   . Hypertension Maternal Aunt   . Stroke Maternal Grandmother   . Diabetes Maternal Grandmother   . Hypertension Maternal Grandmother   . Heart attack Maternal Grandmother   . Breast cancer Maternal Aunt   . Diabetes Maternal Aunt   . Hypertension Maternal Aunt   . Stroke Maternal Aunt   . Breast cancer Maternal Aunt   . Hypertension Maternal Aunt   . Stroke Maternal Aunt     Review of Systems  Constitutional: Negative.   HENT: Negative.   Eyes: Negative.   Respiratory: Negative.   Cardiovascular: Negative.   Gastrointestinal: Negative.   Endocrine: Negative.   Genitourinary: Negative.   Musculoskeletal: Negative.   Skin: Negative.   Allergic/Immunologic: Negative.   Neurological: Negative.   Hematological: Negative.   Psychiatric/Behavioral: Negative.     PHYSICAL EXAMINATION:    BP 120/78    Pulse 70   Resp 16   Wt 221 lb (100.2 kg)   LMP 04/01/2020 (Exact Date)   BMI 35.67 kg/m     General appearance: alert, cooperative, no acute distress  Lymph:  no inguinal LAD noted  Pelvic: External genitalia:  no lesions              Urethra:  normal appearing urethra with no masses, tenderness or lesions              Bartholins and Skenes: normal                 Vagina: redness noted, moderate frothy discharge              Cervix: no cervical motion tenderness and no lesions              Bimanual Exam:  Uterus:  normal size, contour, position, consistency, mobility, non-tender              Adnexa: no mass, fullness, tenderness and bilaterally symetrical               Chaperone, Joy, CMA, was present for exam.  Assessment/Plan: Screen for STD (sexually transmitted disease) - Plan: SURESWAB CT/NG/T. vaginalis, WET PREP FOR TRICH, YEAST, CLUE  Irregular menstrual cycle - Plan: Pregnancy, urine  BV (bacterial vaginosis) - Plan: metroNIDAZOLE (FLAGYL) 500 MG tablet  Pelvic recheck: adnexa palpate bilaterally symmetrical  Encouraged multivitamin  with folic acid to prevent birth defects

## 2020-04-25 ENCOUNTER — Encounter: Payer: Self-pay | Admitting: Nurse Practitioner

## 2020-04-25 ENCOUNTER — Ambulatory Visit: Payer: Commercial Managed Care - PPO | Admitting: Nurse Practitioner

## 2020-04-25 ENCOUNTER — Other Ambulatory Visit: Payer: Self-pay

## 2020-04-25 VITALS — BP 120/78 | HR 70 | Resp 16 | Wt 221.0 lb

## 2020-04-25 DIAGNOSIS — B9689 Other specified bacterial agents as the cause of diseases classified elsewhere: Secondary | ICD-10-CM | POA: Diagnosis not present

## 2020-04-25 DIAGNOSIS — N926 Irregular menstruation, unspecified: Secondary | ICD-10-CM

## 2020-04-25 DIAGNOSIS — Z113 Encounter for screening for infections with a predominantly sexual mode of transmission: Secondary | ICD-10-CM

## 2020-04-25 DIAGNOSIS — N760 Acute vaginitis: Secondary | ICD-10-CM

## 2020-04-25 LAB — PREGNANCY, URINE: Preg Test, Ur: NEGATIVE

## 2020-04-25 LAB — WET PREP FOR TRICH, YEAST, CLUE

## 2020-04-25 MED ORDER — METRONIDAZOLE 500 MG PO TABS: 500.0000 mg | ORAL_TABLET | Freq: Two times a day (BID) | ORAL | 0 refills | Status: DC

## 2020-04-26 LAB — SURESWAB CT/NG/T. VAGINALIS
C. trachomatis RNA, TMA: NOT DETECTED
N. gonorrhoeae RNA, TMA: NOT DETECTED
Trichomonas vaginalis RNA: NOT DETECTED

## 2020-11-22 IMAGING — CT CT RENAL STONE PROTOCOL
2 of 4 series · 16 of 46 positions shown, 18 images · non-contrast
Comparison: None.

CLINICAL DATA: Hematuria.  Left abdominal pain.

EXAM:
CT ABDOMEN AND PELVIS WITHOUT CONTRAST
TECHNIQUE: Multidetector CT imaging of the abdomen and pelvis was performed
following the standard protocol without IV contrast.

[Series 3: renal stone 5.0 · axial · 0.81mm/px · z∈[+648,+1108]mm · 13 of 101 slices shown, 15 images]
[im 5/101  soft-tissue]
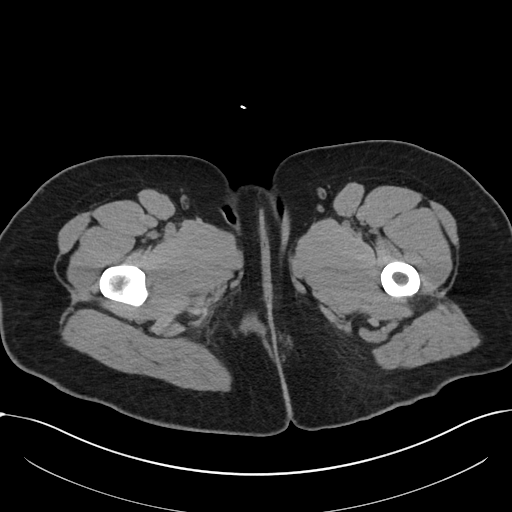
[im 5/101  bone]
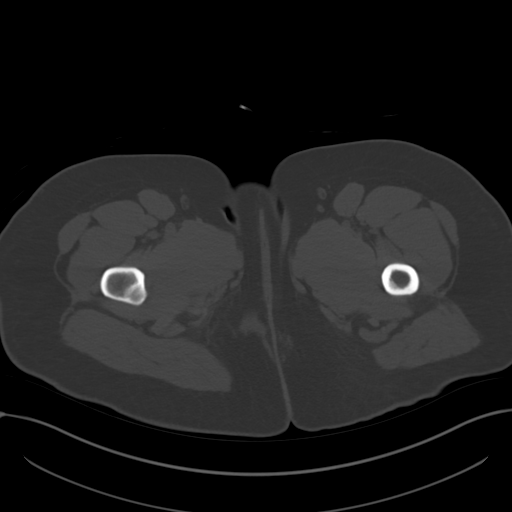
[im 13/101  soft-tissue]
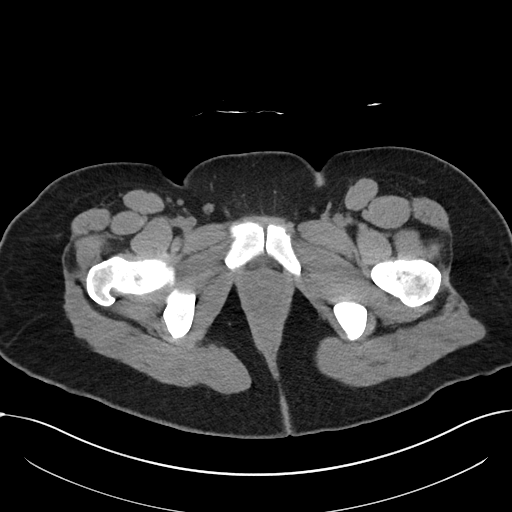
[im 21/101  soft-tissue]
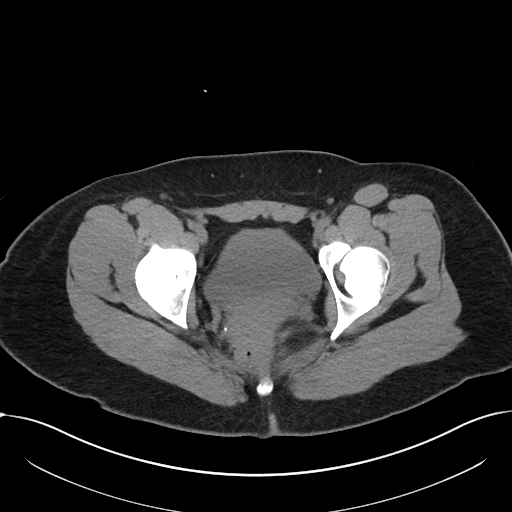
[im 29/101  soft-tissue]
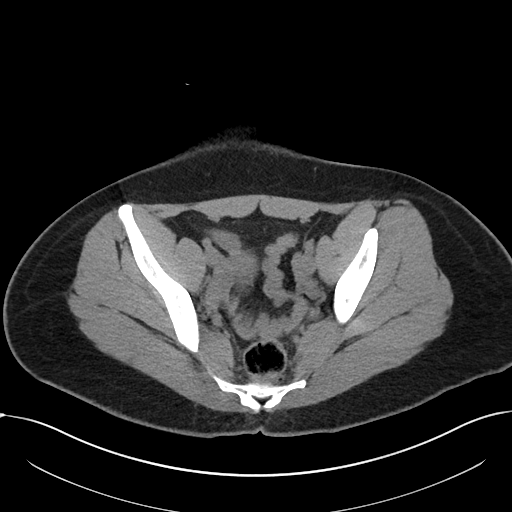
[im 37/101  soft-tissue]
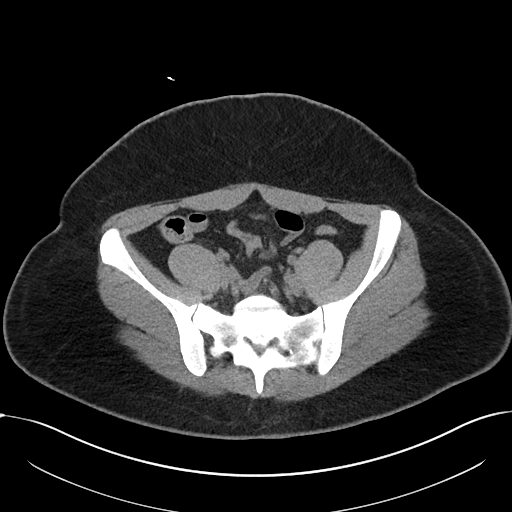
[im 45/101  soft-tissue]
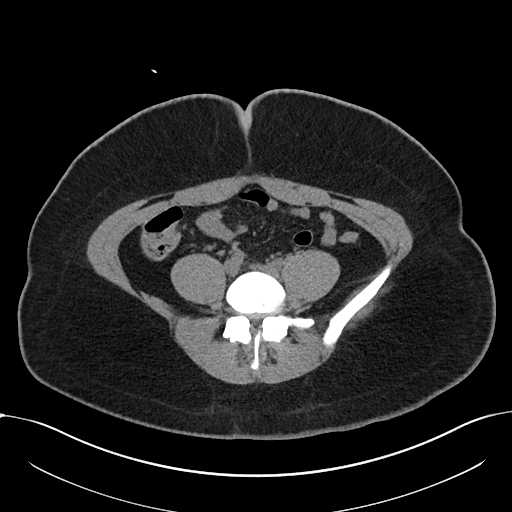
[im 53/101  soft-tissue]
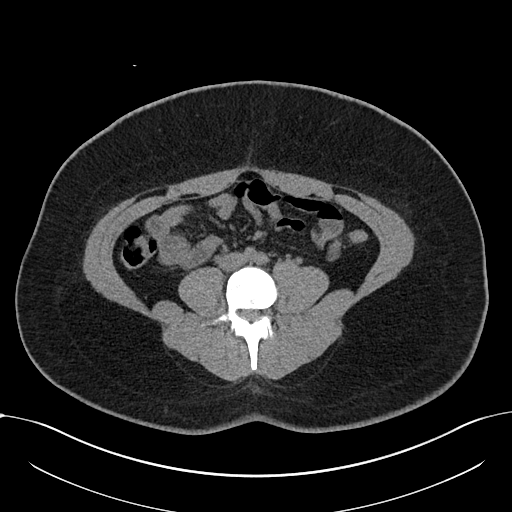
[im 57/101  soft-tissue]
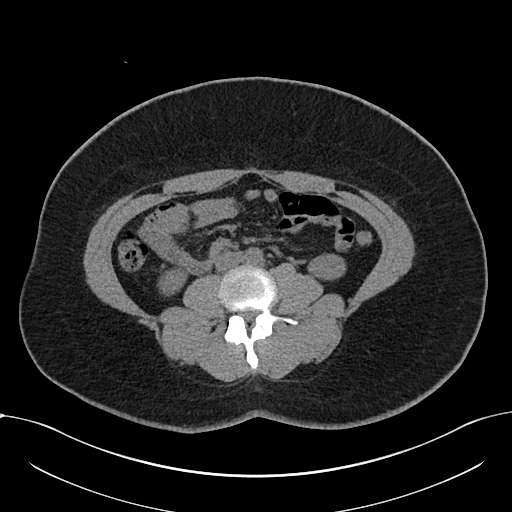
[im 65/101  soft-tissue]
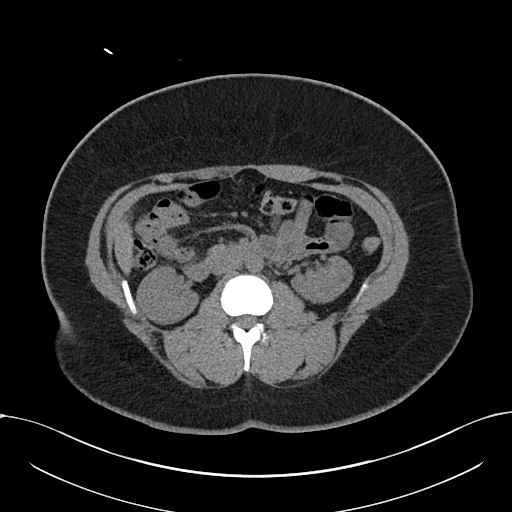
[im 65/101  bone]
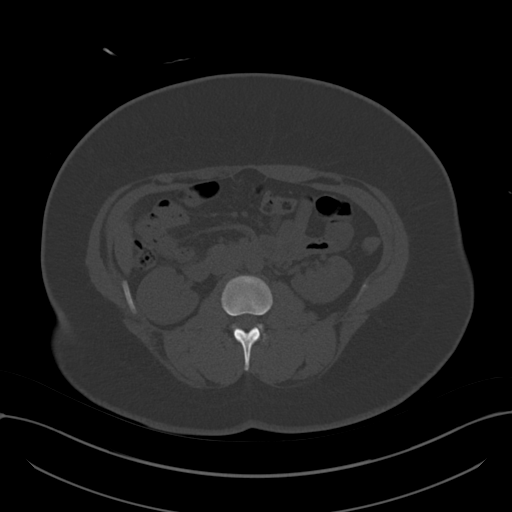
[im 73/101  soft-tissue]
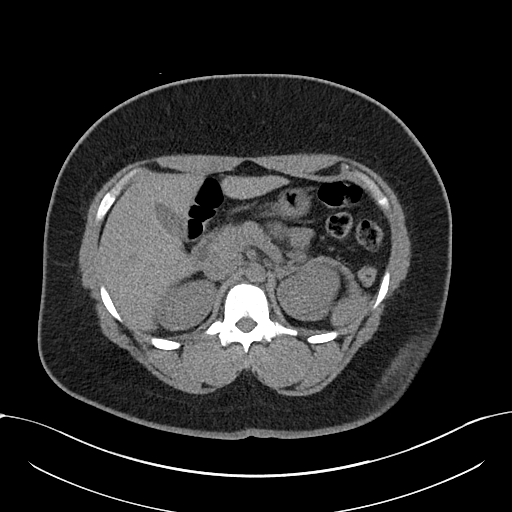
[im 81/101  soft-tissue]
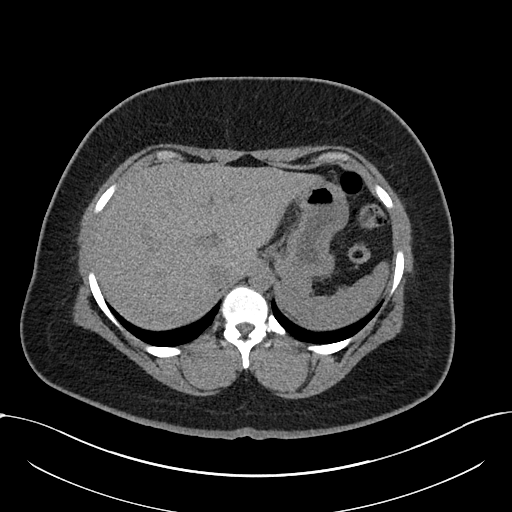
[im 89/101  soft-tissue]
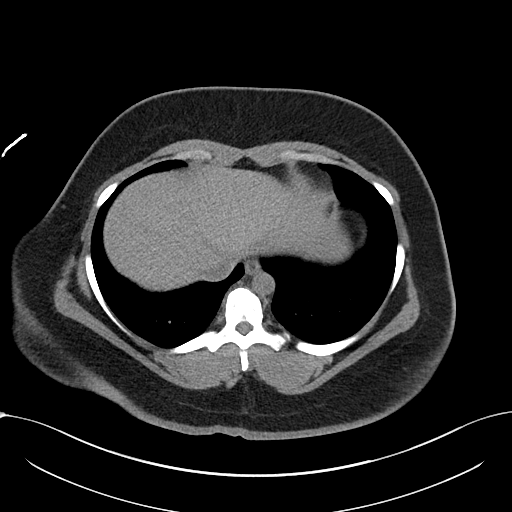
[im 97/101  soft-tissue]
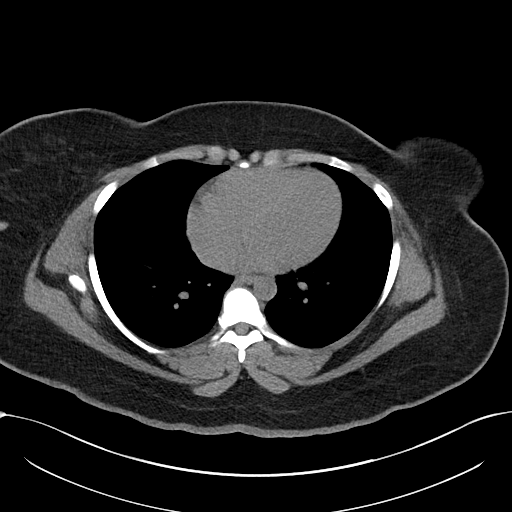

[Series 5: renal stone 3.0 cor · coronal · 0.76mm/px · 3 of 101 slices shown]
[im 34/101  soft-tissue]
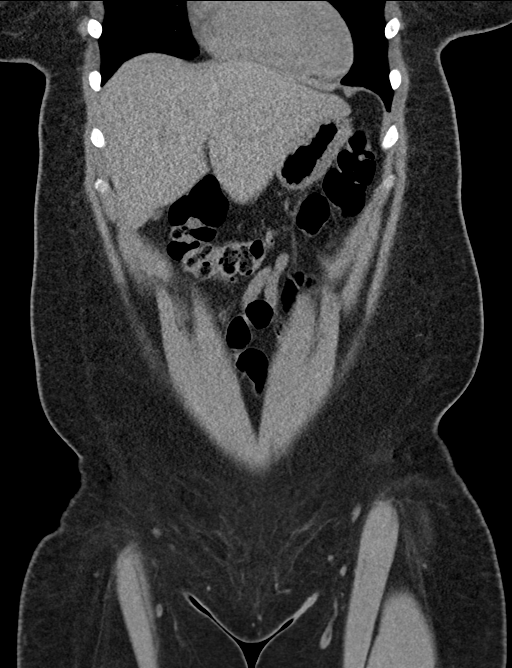
[im 45/101  soft-tissue]
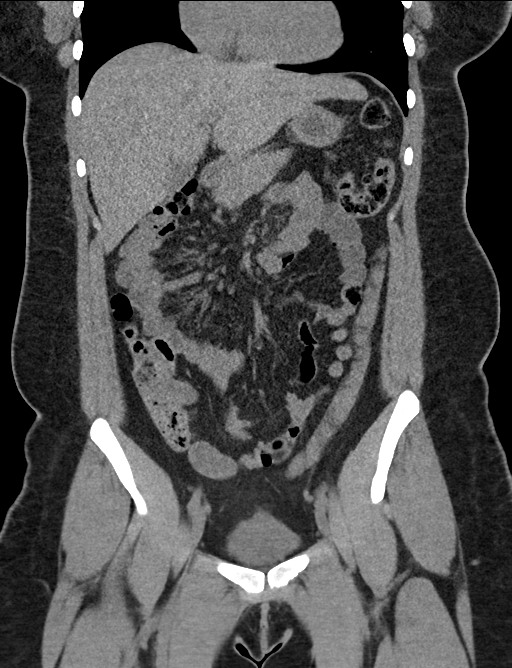
[im 56/101  soft-tissue]
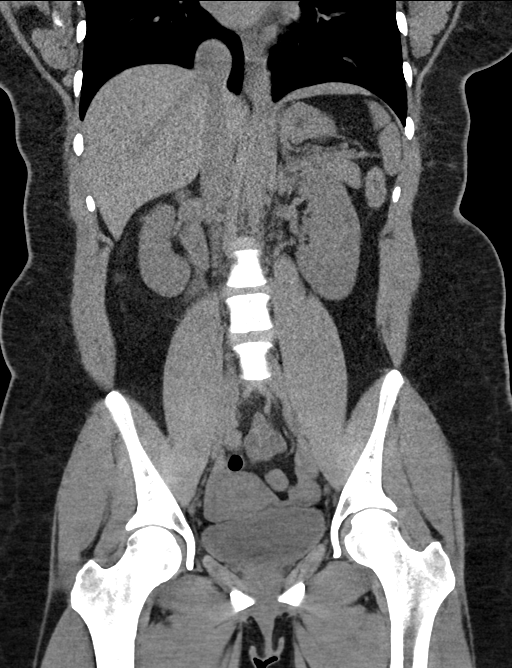

[16 of 46 positions shown; findings below may reference images not displayed]

FINDINGS: Lower chest: No significant pulmonary nodules or acute consolidative
airspace disease.

Hepatobiliary: Normal liver size. No liver mass. Normal gallbladder
with no radiopaque cholelithiasis. No biliary ductal dilatation.

Pancreas: Normal, with no mass or duct dilation.

Spleen: Normal size. No mass.

Adrenals/Urinary Tract: Normal adrenals. No renal stones. No
hydronephrosis. No contour deforming renal masses. Normal caliber
ureters. No ureteral stones. Normal bladder with no bladder stones.

Stomach/Bowel: Normal non-distended stomach. Normal caliber small
bowel with no small bowel wall thickening. Normal appendix. Normal
large bowel with no diverticulosis, large bowel wall thickening or
pericolonic fat stranding.

Vascular/Lymphatic: Normal caliber abdominal aorta. No
pathologically enlarged lymph nodes in the abdomen or pelvis.

Reproductive: Grossly normal uterus.  No adnexal mass.

Other: No pneumoperitoneum, ascites or focal fluid collection.

Musculoskeletal: No aggressive appearing focal osseous lesions.
IMPRESSION: No acute abnormality. No urolithiasis. No hydronephrosis. No
evidence of bowel obstruction or acute bowel inflammation.

## 2020-12-12 ENCOUNTER — Other Ambulatory Visit: Payer: Self-pay | Admitting: Family Medicine

## 2020-12-12 DIAGNOSIS — M5117 Intervertebral disc disorders with radiculopathy, lumbosacral region: Secondary | ICD-10-CM

## 2021-02-24 ENCOUNTER — Ambulatory Visit: Payer: Commercial Managed Care - PPO | Admitting: Nurse Practitioner

## 2021-04-03 ENCOUNTER — Encounter: Payer: Self-pay | Admitting: Nurse Practitioner

## 2021-04-03 ENCOUNTER — Ambulatory Visit (INDEPENDENT_AMBULATORY_CARE_PROVIDER_SITE_OTHER): Payer: 59 | Admitting: Nurse Practitioner

## 2021-04-03 ENCOUNTER — Other Ambulatory Visit: Payer: Self-pay

## 2021-04-03 VITALS — BP 124/82 | Ht 65.5 in | Wt 245.0 lb

## 2021-04-03 DIAGNOSIS — N898 Other specified noninflammatory disorders of vagina: Secondary | ICD-10-CM

## 2021-04-03 DIAGNOSIS — A599 Trichomoniasis, unspecified: Secondary | ICD-10-CM

## 2021-04-03 DIAGNOSIS — N760 Acute vaginitis: Secondary | ICD-10-CM

## 2021-04-03 DIAGNOSIS — B9689 Other specified bacterial agents as the cause of diseases classified elsewhere: Secondary | ICD-10-CM

## 2021-04-03 DIAGNOSIS — Z113 Encounter for screening for infections with a predominantly sexual mode of transmission: Secondary | ICD-10-CM

## 2021-04-03 DIAGNOSIS — Z01419 Encounter for gynecological examination (general) (routine) without abnormal findings: Secondary | ICD-10-CM | POA: Diagnosis not present

## 2021-04-03 LAB — WET PREP FOR TRICH, YEAST, CLUE

## 2021-04-03 MED ORDER — METRONIDAZOLE 500 MG PO TABS: 500.0000 mg | ORAL_TABLET | Freq: Two times a day (BID) | ORAL | 0 refills | Status: DC

## 2021-04-03 MED ORDER — FLUCONAZOLE 150 MG PO TABS: 150.0000 mg | ORAL_TABLET | ORAL | 0 refills | Status: DC

## 2021-04-03 NOTE — Progress Notes (Signed)
Patty Coffey 1990-07-28 [id number]   History:  31 y.o. G1P0010 presents for annual exam. Monthly cycles. Reports HPV + pap at age 24 with negative follow up. Complains of having frequent BV symptoms and feels it is related to menses. Most recently confirmed 02/2020 and 04/2020. Has received Gardasil series.   Gynecologic History Patient's last menstrual period was 03/26/2021. Period Cycle (Days): 25 Period Duration (Days): 4 Period Pattern: Regular Menstrual Flow: Light Dysmenorrhea: (!) Mild Dysmenorrhea Symptoms: Cramping Contraception/Family planning: none Sexually active: Yes  Health Maintenance Last Pap: 02/17/2019. Results were: Normal, 3-year repeat Last mammogram: Not indicated Last colonoscopy: Not indicated Last Dexa: Not indicated  Past medical history, past surgical history, family history and social history were all reviewed and documented in the EPIC chart. 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   ROS:  A ROS was performed and pertinent positives and negatives are included.  Exam:  Vitals:   04/03/21 1111  BP: 124/82  Weight: 245 lb (111.1 kg)  Height: 5' 5.5" (1.664 m)   Body mass index is 40.15 kg/m.  General appearance:  Normal Thyroid:  Symmetrical, normal in size, without palpable masses or nodularity. Respiratory  Auscultation:  Clear without wheezing or rhonchi Cardiovascular  Auscultation:  Regular rate, without rubs, murmurs or gallops  Edema/varicosities:  Not grossly evident Abdominal  Soft,nontender, without masses, guarding or rebound.  Liver/spleen:  No organomegaly noted  Hernia:  None appreciated  Skin  Inspection:  Grossly normal Breasts: Examined lying and sitting.   Right: Without masses, retractions, nipple discharge or axillary adenopathy.   Left: Without masses, retractions, nipple discharge or axillary adenopathy. Genitourinary   Inguinal/mons:  Normal without inguinal adenopathy  External  genitalia:  Normal appearing vulva with no masses, tenderness, or lesions  BUS/Urethra/Skene's glands:  Normal  Vagina:  Discharge and odor present. Mild erythema  Cervix:  Normal appearing without discharge or lesions  Uterus:  Normal in size, shape and contour.  Midline and mobile, nontender  Adnexa/parametria:     Rt: Normal in size, without masses or tenderness.   Lt: Normal in size, without masses or tenderness.  Anus and perineum: Normal  Patient informed chaperone available to be present for breast and pelvic exam. Patient has requested no chaperone to be present. Patient has been advised what will be completed during breast and pelvic exam.   Assessment/Plan:  31 y.o. G1P0010 for annual exam.   Well female exam with routine gynecological exam - Education provided on SBEs, importance of preventative screenings, current guidelines, high calcium diet, regular exercise, and multivitamin daily.  Labs at work.   Screen for STD (sexually transmitted disease) - Plan: RPR, HIV Antibody (routine testing w rflx), C. trachomatis/N. gonorrhoeae RNA  Vaginal discharge - Plan: WET PREP FOR TRICH, YEAST, CLUE. Wet prep positive for clue cells and trichomonas.  Bacterial vaginosis - Plan: metroNIDAZOLE (FLAGYL) 500 MG tablet twice daily x 7 days. Fluconazole (DIFLUCAN) 150 MG tablet if symptoms of yeast infection occur. She reports history of yeast infections with Flagyl use.   Trichomonas infection - Plan: metroNIDAZOLE (FLAGYL) 500 MG tablet twice daily x 7 days. Inform partner so they can be treated, no intercourse for 7 days after completing treatment, return in 3 months for TOC.   Screening for cervical cancer - Reports HPV + pap at age 56 with negative follow up. Will repeat at 3-year interval per guidelines.  Return in 3 months for TOC and 1 year for annual.  [name] DNP, 11:46 AM 04/03/2021

## 2021-04-04 LAB — C. TRACHOMATIS/N. GONORRHOEAE RNA
C. trachomatis RNA, TMA: NOT DETECTED
N. gonorrhoeae RNA, TMA: NOT DETECTED

## 2021-04-04 LAB — HIV ANTIBODY (ROUTINE TESTING W REFLEX): HIV 1&2 Ab, 4th Generation: NONREACTIVE

## 2021-04-04 LAB — RPR: RPR Ser Ql: NONREACTIVE

## 2021-07-02 ENCOUNTER — Ambulatory Visit (INDEPENDENT_AMBULATORY_CARE_PROVIDER_SITE_OTHER): Payer: 59 | Admitting: Nurse Practitioner

## 2021-07-02 ENCOUNTER — Encounter: Payer: Self-pay | Admitting: Nurse Practitioner

## 2021-07-02 VITALS — BP 118/76

## 2021-07-02 DIAGNOSIS — A599 Trichomoniasis, unspecified: Secondary | ICD-10-CM | POA: Diagnosis not present

## 2021-07-02 DIAGNOSIS — N898 Other specified noninflammatory disorders of vagina: Secondary | ICD-10-CM

## 2021-07-02 NOTE — Progress Notes (Signed)
? ?  Acute Office Visit ? ?Subjective:  ? ? Patient ID: Patty Coffey, female    DOB: 1990-12-26, 31 y.o.   MRN: [id number] ? ? ?HPI 31 y.o. presents today for 68-month STI recheck. + trich 04/03/2021. Finished full course of treatment.  Partner treated. She felt like she was getting a yeast infection a couple of days ago.  ? ? ?Review of Systems  ?Constitutional: Negative.   ?Genitourinary:  Positive for vaginal discharge. Negative for vaginal pain.  ? ?   ?Objective:  ?  ?Physical Exam ?Constitutional:   ?   Appearance: Normal appearance.  ?Genitourinary: ?   General: Normal vulva.  ?   Vagina: Vaginal discharge present. No erythema.  ?   Cervix: Normal.  ? ? ?BP 118/76   LMP 06/26/2021  ?Wt Readings from Last 3 Encounters:  ?04/03/21 245 lb (111.1 kg)  ?04/25/20 221 lb (100.2 kg)  ?02/22/20 218 lb (98.9 kg)  ? ? ?   ? ?Patient informed chaperone available to be present for breast and pelvic exam. Patient has requested no chaperone to be present. Patient has been advised what will be completed during breast and pelvic exam.  ? ?Assessment & Plan:  ? ?Problem List Items Addressed This Visit   ?None ?Visit Diagnoses   ? ? Trichomonas infection    -  Primary  ? Relevant Orders  ? SureSwab? Advanced Vaginitis Plus,TMA  ? Vaginal discharge      ? Relevant Orders  ? SureSwab? Advanced Vaginitis Plus,TMA  ? ?  ? ?Plan: Vaginitis panel pending. Will reach out with results.  ? ? ? ? ?Tamela Gammon DNP, 8:13 AM 07/02/2021 ? ?

## 2021-07-03 LAB — SURESWAB® ADVANCED VAGINITIS PLUS,TMA
C. trachomatis RNA, TMA: NOT DETECTED
CANDIDA SPECIES: NOT DETECTED
Candida glabrata: NOT DETECTED
N. gonorrhoeae RNA, TMA: NOT DETECTED
SURESWAB(R) ADV BACTERIAL VAGINOSIS(BV),TMA: POSITIVE — AB
TRICHOMONAS VAGINALIS (TV),TMA: DETECTED — AB

## 2021-07-04 ENCOUNTER — Telehealth: Payer: Self-pay | Admitting: *Deleted

## 2021-07-04 ENCOUNTER — Encounter: Payer: Self-pay | Admitting: Nurse Practitioner

## 2021-07-04 ENCOUNTER — Encounter: Payer: Self-pay | Admitting: Obstetrics and Gynecology

## 2021-07-04 DIAGNOSIS — A599 Trichomoniasis, unspecified: Secondary | ICD-10-CM

## 2021-07-04 DIAGNOSIS — B9689 Other specified bacterial agents as the cause of diseases classified elsewhere: Secondary | ICD-10-CM

## 2021-07-04 MED ORDER — METRONIDAZOLE 500 MG PO TABS: 500.0000 mg | ORAL_TABLET | Freq: Two times a day (BID) | ORAL | 0 refills | Status: AC

## 2021-07-04 NOTE — Telephone Encounter (Signed)
Ok for treatment of trichomonas and bacterial vaginosis.  ? ?Flagyl 500 mg po bid x 7 days.  ? ?Please offer expedited partner therapy.  ? ?Abstain from intercourse for 1 week after the last partner is treated.  ? ?Condoms can help prevent STDs.  ? ?She will need an office visit for recheck of trichomonas in 4 weeks.  ?

## 2021-07-04 NOTE — Telephone Encounter (Signed)
Patty Coffey is out of the office)  ? ?Patient received her test results via my chart and would like to have treatment before the weekend. Please advise  ?

## 2021-07-04 NOTE — Telephone Encounter (Signed)
FYI. EPT Called in as well per pt's request.  ?

## 2021-07-04 NOTE — Telephone Encounter (Signed)
Please see phone note  

## 2021-07-04 NOTE — Telephone Encounter (Signed)
Preferred therapy for a female partner is is Flagyl 2 grams orally all at once.  ?

## 2021-07-04 NOTE — Telephone Encounter (Signed)
Pt notified and voiced understanding. Rx sent. TOC scheduled for 07/30/21.  ? ?Partner info given. Would you mind just confirming that Rx for partner is Flagyl 2gm all at once? If not, advise the alternative please and ty.  ?

## 2021-07-30 ENCOUNTER — Encounter: Payer: Self-pay | Admitting: Nurse Practitioner

## 2021-07-30 ENCOUNTER — Ambulatory Visit (INDEPENDENT_AMBULATORY_CARE_PROVIDER_SITE_OTHER): Payer: 59 | Admitting: Nurse Practitioner

## 2021-07-30 VITALS — BP 130/80

## 2021-07-30 DIAGNOSIS — A599 Trichomoniasis, unspecified: Secondary | ICD-10-CM | POA: Diagnosis not present

## 2021-07-30 LAB — WET PREP FOR TRICH, YEAST, CLUE

## 2021-07-30 NOTE — Progress Notes (Signed)
? ?  Acute Office Visit ? ?Subjective:  ? ? Patient ID: Patty Coffey, female    DOB: 09-20-1990, 31 y.o.   MRN: [id number] ? ? ?HPI ?31 y.o. presents today for TOC. + trichomonas 04/03/2021 and 07/02/2021. She completed full course of antibiotics, partner was treated, and she has not been sexually active since. No symptoms today.  ? ? ?Review of Systems  ?Constitutional: Negative.   ?Genitourinary: Negative.   ? ?   ?Objective:  ?  ?Physical Exam ?Constitutional:   ?   Appearance: Normal appearance.  ?Genitourinary: ?   General: Normal vulva.  ?   Vagina: Normal.  ?   Cervix: Normal.  ? ? ?BP 130/80   LMP 07/19/2021  ?Wt Readings from Last 3 Encounters:  ?04/03/21 245 lb (111.1 kg)  ?04/25/20 221 lb (100.2 kg)  ?02/22/20 218 lb (98.9 kg)  ? ? ?   ? ?Patient informed chaperone available to be present for breast and pelvic exam. Patient has requested no chaperone to be present. Patient has been advised what will be completed during breast and pelvic exam.  ? ?Wet prep negative ? ?Assessment & Plan:  ? ?Problem List Items Addressed This Visit   ?None ?Visit Diagnoses   ? ? Trichomonas infection    -  Primary  ? Relevant Orders  ? WET PREP FOR TRICH, YEAST, CLUE  ? ?  ? ?Plan: Negative wet prep. Safe sex practices encouraged.  ? ? ? ? ?[name] DNP, 1:55 PM 07/30/2021 ? ?

## 2022-02-09 DIAGNOSIS — N898 Other specified noninflammatory disorders of vagina: Secondary | ICD-10-CM | POA: Diagnosis not present

## 2022-02-09 DIAGNOSIS — Z113 Encounter for screening for infections with a predominantly sexual mode of transmission: Secondary | ICD-10-CM | POA: Diagnosis not present

## 2022-02-09 DIAGNOSIS — N925 Other specified irregular menstruation: Secondary | ICD-10-CM | POA: Diagnosis not present

## 2022-02-09 DIAGNOSIS — Z01419 Encounter for gynecological examination (general) (routine) without abnormal findings: Secondary | ICD-10-CM | POA: Diagnosis not present

## 2022-02-09 DIAGNOSIS — Z124 Encounter for screening for malignant neoplasm of cervix: Secondary | ICD-10-CM | POA: Diagnosis not present

## 2022-02-09 DIAGNOSIS — Z369 Encounter for antenatal screening, unspecified: Secondary | ICD-10-CM | POA: Diagnosis not present

## 2022-02-09 DIAGNOSIS — Z01411 Encounter for gynecological examination (general) (routine) with abnormal findings: Secondary | ICD-10-CM | POA: Diagnosis not present

## 2022-02-09 DIAGNOSIS — N912 Amenorrhea, unspecified: Secondary | ICD-10-CM | POA: Diagnosis not present

## 2022-02-09 LAB — OB RESULTS CONSOLE RPR: RPR: NONREACTIVE

## 2022-02-09 LAB — OB RESULTS CONSOLE HIV ANTIBODY (ROUTINE TESTING): HIV: NONREACTIVE

## 2022-02-09 LAB — HEPATITIS C ANTIBODY: HCV Ab: NEGATIVE

## 2022-02-09 LAB — OB RESULTS CONSOLE HEPATITIS B SURFACE ANTIGEN: Hepatitis B Surface Ag: NEGATIVE

## 2022-02-09 LAB — OB RESULTS CONSOLE ABO/RH: RH Type: POSITIVE

## 2022-02-09 LAB — OB RESULTS CONSOLE RUBELLA ANTIBODY, IGM: Rubella: IMMUNE

## 2022-02-09 LAB — OB RESULTS CONSOLE ANTIBODY SCREEN: Antibody Screen: NEGATIVE

## 2022-02-09 LAB — OB RESULTS CONSOLE GC/CHLAMYDIA
Chlamydia: NEGATIVE
Neisseria Gonorrhea: NEGATIVE

## 2022-02-14 ENCOUNTER — Other Ambulatory Visit: Payer: Self-pay

## 2022-03-16 NOTE — L&D Delivery Note (Signed)
Delivery Note At 5:47 AM a viable female was delivered via Vaginal, Spontaneous (Presentation: Left Occiput Anterior).  APGAR: 9, 9; weight 7 lb 12.5 oz (3530 g).   Placenta status: Spontaneous, Intact.  Cord: 3 vessels with the following complications: None.  Cord pH: not sent  Anesthesia: None Episiotomy: None Lacerations:  second degree  perineal laceration Suture Repair: 2.0 chromic Est. Blood Loss (mL): 280.  Blood in bag was not measured  Mom to  [facility] specialty for magnesium .  Baby to Couplet care / Skin to Skin.  Tran Randle A Edgar Corrigan 08/19/2022, 6:50 AM

## 2022-03-23 ENCOUNTER — Encounter: Payer: Self-pay | Admitting: Nurse Practitioner

## 2022-04-06 ENCOUNTER — Ambulatory Visit: Payer: 59 | Admitting: Nurse Practitioner

## 2022-04-09 DIAGNOSIS — Z369 Encounter for antenatal screening, unspecified: Secondary | ICD-10-CM | POA: Diagnosis not present

## 2022-04-09 DIAGNOSIS — Z3A19 19 weeks gestation of pregnancy: Secondary | ICD-10-CM | POA: Diagnosis not present

## 2022-04-09 DIAGNOSIS — Z363 Encounter for antenatal screening for malformations: Secondary | ICD-10-CM | POA: Diagnosis not present

## 2022-05-20 ENCOUNTER — Inpatient Hospital Stay (HOSPITAL_COMMUNITY)
Admission: AD | Admit: 2022-05-20 | Discharge: 2022-05-20 | Disposition: A | Discharge status: Home / Self Care | Payer: No Typology Code available for payment source | Attending: Obstetrics & Gynecology | Admitting: Obstetrics & Gynecology

## 2022-05-20 ENCOUNTER — Inpatient Hospital Stay (HOSPITAL_COMMUNITY): Payer: No Typology Code available for payment source

## 2022-05-20 ENCOUNTER — Encounter (HOSPITAL_COMMUNITY): Payer: Self-pay | Admitting: *Deleted

## 2022-05-20 DIAGNOSIS — Z3A24 24 weeks gestation of pregnancy: Secondary | ICD-10-CM

## 2022-05-20 DIAGNOSIS — O26892 Other specified pregnancy related conditions, second trimester: Secondary | ICD-10-CM | POA: Diagnosis present

## 2022-05-20 DIAGNOSIS — O9A212 Injury, poisoning and certain other consequences of external causes complicating pregnancy, second trimester: Secondary | ICD-10-CM | POA: Insufficient documentation

## 2022-05-20 DIAGNOSIS — M542 Cervicalgia: Secondary | ICD-10-CM | POA: Insufficient documentation

## 2022-05-20 DIAGNOSIS — R071 Chest pain on breathing: Secondary | ICD-10-CM | POA: Insufficient documentation

## 2022-05-20 DIAGNOSIS — R0782 Intercostal pain: Secondary | ICD-10-CM

## 2022-05-20 DIAGNOSIS — Z041 Encounter for examination and observation following transport accident: Secondary | ICD-10-CM | POA: Diagnosis not present

## 2022-05-20 HISTORY — DX: Dermatitis, unspecified: L30.9

## 2022-05-20 LAB — ABO/RH: ABO/RH(D): A POS
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
## 2022-05-20 NOTE — MAU Note (Signed)
To radiology
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
## 2022-06-13 ENCOUNTER — Inpatient Hospital Stay (HOSPITAL_COMMUNITY)
Admission: AD | Admit: 2022-06-13 | Discharge: 2022-06-13 | Disposition: A | Discharge status: Home / Self Care | Payer: 59 | Attending: Obstetrics and Gynecology | Admitting: Obstetrics and Gynecology

## 2022-06-13 ENCOUNTER — Encounter (HOSPITAL_COMMUNITY): Payer: Self-pay | Admitting: Obstetrics and Gynecology

## 2022-06-13 DIAGNOSIS — R112 Nausea with vomiting, unspecified: Secondary | ICD-10-CM | POA: Diagnosis not present

## 2022-06-13 DIAGNOSIS — R197 Diarrhea, unspecified: Secondary | ICD-10-CM | POA: Diagnosis not present

## 2022-06-13 DIAGNOSIS — O219 Vomiting of pregnancy, unspecified: Secondary | ICD-10-CM | POA: Insufficient documentation

## 2022-06-13 DIAGNOSIS — Z3A28 28 weeks gestation of pregnancy: Secondary | ICD-10-CM | POA: Diagnosis not present

## 2022-06-13 LAB — URINALYSIS, ROUTINE W REFLEX MICROSCOPIC
Bacteria, UA: NONE SEEN
Bilirubin Urine: NEGATIVE
Glucose, UA: NEGATIVE mg/dL
Ketones, ur: 20 mg/dL — AB
Leukocytes,Ua: NEGATIVE
Nitrite: NEGATIVE
Protein, ur: 30 mg/dL — AB
Specific Gravity, Urine: 1.026 (ref 1.005–1.030)
pH: 5 (ref 5.0–8.0)

## 2022-06-13 MED ORDER — FAMOTIDINE IN NACL 20-0.9 MG/50ML-% IV SOLN
20.0000 mg | Freq: Once | INTRAVENOUS | Status: AC
  Administered 2022-06-13: 20 mg via INTRAVENOUS
  Filled 2022-06-13: qty 50

## 2022-06-13 MED ORDER — LACTATED RINGERS IV SOLN
Freq: Once | INTRAVENOUS | Status: AC
  Filled 2022-06-13: qty 1000

## 2022-06-13 MED ORDER — ONDANSETRON 4 MG PO TBDP: 4.0000 mg | ORAL_TABLET | Freq: Three times a day (TID) | ORAL | 0 refills | Status: DC | PRN

## 2022-06-13 MED ORDER — PROMETHAZINE HCL 25 MG/ML IJ SOLN
25.0000 mg | Freq: Once | INTRAVENOUS | Status: AC
  Administered 2022-06-13: 25 mg via INTRAVENOUS
  Filled 2022-06-13: qty 1

## 2022-06-13 MED ORDER — SODIUM CHLORIDE 0.9 % IV SOLN
8.0000 mg | Freq: Once | INTRAVENOUS | Status: AC
  Administered 2022-06-13: 8 mg via INTRAVENOUS
  Filled 2022-06-13: qty 4

## 2022-06-13 NOTE — MAU Provider Note (Signed)
History     CSN: [id number]  Arrival date and time: 06/13/22 1048   Event Date/Time   First Provider Initiated Contact with Patient 06/13/22 1153      Chief Complaint  Patient presents with   Emesis   Abdominal Pain   Diarrhea   Headache   Light Headed   HPI Ms. Jaylianna Picklesimer is a 32 y.o. year old G14P0010 female at [redacted]w[redacted]d weeks gestation who presents to MAU reporting vomiting, diarrhea, H/A, mid-abdominal pain since she woke up at 0900. She ate a banana this morning and immediately threw it up. She reports she has continued to vomit x 10 episodes. She has had 3 episodes of diarrhea since 1015. She reports feeling light-headed and H/A in the care. Abdominal cramping above belly button and stretches from side to side; rated 4/10. She has no knowledge of sick contacts. She denies VB or LOF. She reports (+) FM. She has a h/o of migraines throughout the pregnancy. She receives [facility] with [facility]; next appt is 06/26/2022.   OB History     Gravida  2   Para      Term      Preterm      AB  1   Living         SAB      IAB  1   Ectopic      Multiple      Live Births              Past Medical History:  Diagnosis Date   Abnormal Pap smear of cervix    Asthma    Eczema    Migraines    Ovarian cyst    Ovarian cyst    left side   STD (sexually transmitted disease)    trichomonas(January, 2023 and April, 2023)    Past Surgical History:  Procedure Laterality Date   NO PAST SURGERIES      Family History  Problem Relation Age of Onset   Bone cancer Mother    Diabetes Mother    Ovarian cancer Maternal Aunt    Diabetes Maternal Aunt    Hypertension Maternal Aunt    Stroke Maternal Grandmother    Diabetes Maternal Grandmother    Hypertension Maternal Grandmother    Heart attack Maternal Grandmother    Breast cancer Maternal Aunt    Diabetes Maternal Aunt    Hypertension Maternal Aunt    Stroke Maternal Aunt    Breast cancer Maternal Aunt     Hypertension Maternal Aunt    Stroke Maternal Aunt     Social History   Tobacco Use   Smoking status: Never   Smokeless tobacco: Never  Substance Use Topics   Alcohol use: Not Currently    Alcohol/week: 1.0 standard drink of alcohol    Types: 1 Glasses of wine per week   Drug use: Never    Allergies:  Allergies  Allergen Reactions   Toradol [Ketorolac Tromethamine] Hives    Medications Prior to Admission  Medication Sig Dispense Refill Last Dose   Prenatal Vit-Fe Fumarate-FA (MULTIVITAMIN-PRENATAL) 27-0.8 MG TABS tablet Take 1 tablet by mouth daily at 12 noon.       Review of Systems  Constitutional: Negative.   HENT: Negative.    Eyes: Negative.   Respiratory: Negative.    Cardiovascular: Negative.   Gastrointestinal:  Positive for diarrhea, nausea and vomiting (10x since it started this AM).  Endocrine: Negative.   Genitourinary: Negative.   Musculoskeletal:  Negative.   Skin: Negative.   Allergic/Immunologic: Negative.   Neurological: Negative.   Hematological: Negative.   Psychiatric/Behavioral: Negative.     Physical Exam   Blood pressure 126/68, pulse (!) 107, temperature 99 F (37.2 C), temperature source Oral, resp. rate 19, height 5' 5.5" (1.664 m), weight 110.5 kg, last menstrual period 07/19/2021, SpO2 99 %.  Physical Exam  MAU Course  Procedures  MDM IVFs: Phenergan 25 mg in LR 1000 ml @ 999 ml/hr -- resolved nausea/vomiting MVI 1 ampule in LR 1000 ml @ 999 ml/hr Zofran 8 mg IVPB  -- resolved nausea/vomiting  Pepcid 20 mg IVPB PO Challenge -- patient tolerated well  Assessment and Plan  1. Nausea vomiting and diarrhea  2. [redacted] weeks gestation of pregnancy   - Discharge patient - Keep scheduled appt with [facility] on 06/26/2022 - Patient verbalized an understanding of the plan of care and agrees.'  Laury Deep, CNM 06/13/2022, 11:53 AM

## 2022-06-13 NOTE — MAU Note (Addendum)
...  Patty Coffey is a 32 y.o. at [redacted]w[redacted]d here in MAU reporting: New onset emesis, diarrhea, HA, and mid abdominal pain. She reports she woke up around 0900 and ate a banana and immediately threw this up. She reports she has continued to vomit and this has occurred around 10 times. She reports 45 minutes ago she began experiencing diarrhea and has had three episodes. She also reports that on her way here she felt light headed in the car and began experiencing a HA as well as abdominal cramping that is above her belly button and stretches from side to side. She states no one she knows has a stomach bug or is sick. Denies VB or LOF. +FM.   Hx of migraines. Reports HA's throughout pregnancy.   Onset of complaint: 0900  Pain scores:  4/10 abdomen 7/10 HA - right sided   FHT: initial external Lab orders placed from triage:  UA - unable to leave a urine sample at this time

## 2022-06-15 DIAGNOSIS — Z362 Encounter for other antenatal screening follow-up: Secondary | ICD-10-CM | POA: Diagnosis not present

## 2022-06-16 ENCOUNTER — Other Ambulatory Visit: Payer: Self-pay

## 2022-06-22 DIAGNOSIS — M25531 Pain in right wrist: Secondary | ICD-10-CM | POA: Diagnosis not present

## 2022-06-24 ENCOUNTER — Other Ambulatory Visit: Payer: Self-pay | Admitting: Obstetrics and Gynecology

## 2022-06-24 DIAGNOSIS — Z6841 Body Mass Index (BMI) 40.0 and over, adult: Secondary | ICD-10-CM

## 2022-06-24 DIAGNOSIS — Z3A31 31 weeks gestation of pregnancy: Secondary | ICD-10-CM

## 2022-06-24 DIAGNOSIS — Z363 Encounter for antenatal screening for malformations: Secondary | ICD-10-CM

## 2022-06-29 DIAGNOSIS — O99210 Obesity complicating pregnancy, unspecified trimester: Secondary | ICD-10-CM | POA: Insufficient documentation

## 2022-07-06 ENCOUNTER — Encounter: Payer: Self-pay | Admitting: *Deleted

## 2022-07-06 ENCOUNTER — Ambulatory Visit: Payer: 59 | Attending: Obstetrics and Gynecology

## 2022-07-06 ENCOUNTER — Ambulatory Visit: Payer: 59 | Admitting: *Deleted

## 2022-07-06 ENCOUNTER — Other Ambulatory Visit: Payer: Self-pay | Admitting: *Deleted

## 2022-07-06 VITALS — BP 119/53 | HR 87

## 2022-07-06 DIAGNOSIS — O99213 Obesity complicating pregnancy, third trimester: Secondary | ICD-10-CM

## 2022-07-06 DIAGNOSIS — Z3A31 31 weeks gestation of pregnancy: Secondary | ICD-10-CM | POA: Diagnosis not present

## 2022-07-06 DIAGNOSIS — Z363 Encounter for antenatal screening for malformations: Secondary | ICD-10-CM

## 2022-07-06 DIAGNOSIS — Z6841 Body Mass Index (BMI) 40.0 and over, adult: Secondary | ICD-10-CM | POA: Diagnosis not present

## 2022-07-12 ENCOUNTER — Inpatient Hospital Stay (HOSPITAL_COMMUNITY)
Admission: AD | Admit: 2022-07-12 | Discharge: 2022-07-12 | Disposition: A | Discharge status: Home / Self Care | Payer: 59 | Attending: Obstetrics & Gynecology | Admitting: Obstetrics & Gynecology

## 2022-07-12 ENCOUNTER — Other Ambulatory Visit: Payer: Self-pay

## 2022-07-12 DIAGNOSIS — O23593 Infection of other part of genital tract in pregnancy, third trimester: Secondary | ICD-10-CM | POA: Insufficient documentation

## 2022-07-12 DIAGNOSIS — R109 Unspecified abdominal pain: Secondary | ICD-10-CM | POA: Diagnosis present

## 2022-07-12 DIAGNOSIS — B9689 Other specified bacterial agents as the cause of diseases classified elsewhere: Secondary | ICD-10-CM | POA: Insufficient documentation

## 2022-07-12 DIAGNOSIS — N760 Acute vaginitis: Secondary | ICD-10-CM

## 2022-07-12 DIAGNOSIS — Z3A32 32 weeks gestation of pregnancy: Secondary | ICD-10-CM | POA: Diagnosis not present

## 2022-07-12 DIAGNOSIS — N898 Other specified noninflammatory disorders of vagina: Secondary | ICD-10-CM

## 2022-07-12 LAB — URINALYSIS, ROUTINE W REFLEX MICROSCOPIC
Bilirubin Urine: NEGATIVE
Glucose, UA: NEGATIVE mg/dL
Ketones, ur: NEGATIVE mg/dL
Leukocytes,Ua: NEGATIVE
Nitrite: NEGATIVE
Protein, ur: 30 mg/dL — AB
Specific Gravity, Urine: 1.013 (ref 1.005–1.030)
pH: 6 (ref 5.0–8.0)

## 2022-07-12 LAB — WET PREP, GENITAL
Sperm: NONE SEEN
Trich, Wet Prep: NONE SEEN
WBC, Wet Prep HPF POC: >=10 — AB (ref <10)
Yeast Wet Prep HPF POC: NONE SEEN

## 2022-07-12 MED ORDER — METRONIDAZOLE 500 MG PO TABS: 500.0000 mg | ORAL_TABLET | Freq: Two times a day (BID) | ORAL | 0 refills | Status: DC

## 2022-07-12 MED ORDER — NIFEDIPINE 10 MG PO CAPS
10.0000 mg | ORAL_CAPSULE | ORAL | Status: DC
  Filled 2022-07-12: qty 1

## 2022-07-12 MED ORDER — LACTATED RINGERS IV BOLUS
1000.0000 mL | Freq: Once | INTRAVENOUS | Status: AC
  Administered 2022-07-12: 1000 mL via INTRAVENOUS

## 2022-07-12 NOTE — MAU Provider Note (Incomplete)
History     CSN: [id number]  Arrival date and time: 07/12/22 1944   None     Chief Complaint  Patient presents with  . Abdominal Pain   [name] is a 32 y.o. year old G41P0010 female at [redacted]w[redacted]d weeks gestation who presents to MAU reporting ***.    OB History     Gravida  2   Para      Term      Preterm      AB  1   Living         SAB      IAB  1   Ectopic      Multiple      Live Births              Past Medical History:  Diagnosis Date  . Abnormal Pap smear of cervix   . Asthma   . Eczema   . Migraines   . Ovarian cyst   . Ovarian cyst    left side  . STD (sexually transmitted disease)    trichomonas(January, 2023 and April, 2023)    Past Surgical History:  Procedure Laterality Date  . NO PAST SURGERIES      Family History  Problem Relation Age of Onset  . Bone cancer Mother   . Diabetes Mother   . Ovarian cancer Maternal Aunt   . Diabetes Maternal Aunt   . Hypertension Maternal Aunt   . Stroke Maternal Grandmother   . Diabetes Maternal Grandmother   . Hypertension Maternal Grandmother   . Heart attack Maternal Grandmother   . Breast cancer Maternal Aunt   . Diabetes Maternal Aunt   . Hypertension Maternal Aunt   . Stroke Maternal Aunt   . Breast cancer Maternal Aunt   . Hypertension Maternal Aunt   . Stroke Maternal Aunt     Social History   Tobacco Use  . Smoking status: Never  . Smokeless tobacco: Never  Vaping Use  . Vaping Use: Never used  Substance Use Topics  . Alcohol use: Not Currently    Alcohol/week: 1.0 standard drink of alcohol    Types: 1 Glasses of wine per week  . Drug use: Never    Allergies:  Allergies  Allergen Reactions  . Toradol [Ketorolac Tromethamine] Hives    Medications Prior to Admission  Medication Sig Dispense Refill Last Dose  . ondansetron (ZOFRAN-ODT) 4 MG disintegrating tablet Take 1 tablet (4 mg total) by mouth every 8 (eight) hours as needed for nausea or vomiting. 15  tablet 0   . Prenatal Vit-Fe Fumarate-FA (MULTIVITAMIN-PRENATAL) 27-0.8 MG TABS tablet Take 1 tablet by mouth daily at 12 noon.       Review of Systems  Constitutional: Negative.   HENT: Negative.    Eyes: Negative.   Respiratory: Negative.    Cardiovascular: Negative.   Gastrointestinal: Negative.   Endocrine: Negative.   Genitourinary:  Positive for vaginal discharge (losing mucous plug).  Musculoskeletal: Negative.   Skin: Negative.   Allergic/Immunologic: Negative.   Neurological: Negative.   Hematological: Negative.   Psychiatric/Behavioral: Negative.     Physical Exam   Blood pressure (!) 111/57, pulse (!) 101, temperature 98.4 F (36.9 C), temperature source Oral, resp. rate 19, height 5' 5.5" (1.664 m), weight 113.1 kg, last menstrual period 12/06/2021, SpO2 100 %.  Physical Exam Vitals and nursing note reviewed. Exam conducted with a chaperone present.  Constitutional:      Appearance: Normal appearance. She is obese.  Cardiovascular:     Rate and Rhythm: Tachycardia present.  Pulmonary:     Effort: Pulmonary effort is normal.  Abdominal:     Palpations: Abdomen is soft.  Genitourinary:    General: Normal vulva.     Comments: Pelvic exam: External genitalia normal, SE: vaginal walls pink and well rugated, cervix is smooth, pink, no lesions, moderate amt of thick. clumpy, white vaginal d/c -- WP, GC/CT done, cervix visually closed, Uterus is non-tender, S=D, no CMT or friability, no adnexal tenderness.  Musculoskeletal:        General: Normal range of motion.  Skin:    General: Skin is warm and dry.  Neurological:     Mental Status: She is alert and oriented to person, place, and time.  Psychiatric:        Mood and Affect: Mood normal.        Behavior: Behavior normal.        Thought Content: Thought content normal.        Judgment: Judgment normal.    REACTIVE NST - FHR: 135 bpm / moderate variability / accels present / decels absent / TOCO: UI noted    MAU Course  Procedures  MDM CCUA Wet Prep GC/CT -- Results pending  LR bolus  Procardia 10 mg po every 20 mins x 3 doses -- withheld and d/c'd due to low BP  Results for orders placed or performed during the hospital encounter of 07/12/22 (from the past 24 hour(s))  Urinalysis, Routine w reflex microscopic -Urine, Clean Catch     Status: Abnormal   Collection Time: 07/12/22  8:58 PM  Result Value Ref Range   Color, Urine YELLOW YELLOW   APPearance HAZY (A) CLEAR   Specific Gravity, Urine 1.013 1.005 - 1.030   pH 6.0 5.0 - 8.0   Glucose, UA NEGATIVE NEGATIVE mg/dL   Hgb urine dipstick SMALL (A) NEGATIVE   Bilirubin Urine NEGATIVE NEGATIVE   Ketones, ur NEGATIVE NEGATIVE mg/dL   Protein, ur 30 (A) NEGATIVE mg/dL   Nitrite NEGATIVE NEGATIVE   Leukocytes,Ua NEGATIVE NEGATIVE   RBC / HPF 0-5 0 - 5 RBC/hpf   WBC, UA 0-5 0 - 5 WBC/hpf   Bacteria, UA RARE (A) NONE SEEN   Squamous Epithelial / HPF 11-20 0 - 5 /HPF   Mucus PRESENT   Wet prep, genital     Status: Abnormal   Collection Time: 07/12/22  8:58 PM  Result Value Ref Range   Yeast Wet Prep HPF POC NONE SEEN NONE SEEN   Trich, Wet Prep NONE SEEN NONE SEEN   Clue Cells Wet Prep HPF POC PRESENT (A) NONE SEEN   WBC, Wet Prep HPF POC >=10 (A) <10   Sperm NONE SEEN     Assessment and Plan  1. Bacterial vaginosis - Information provided on BV - Rx: Flagyl 500 mg po BID x 7 days   2. Vaginal discharge during pregnancy in third trimester - Reassurance given that no mucous  was seen in vagina  3. [redacted] weeks gestation of pregnancy   - Discharge patient - Keep scheduled appt with [facility] on 07/24/2022 - Patient verbalized an understanding of the plan of care and agrees.   Raelyn Mora, CNM 07/12/2022, 9:16 PM

## 2022-07-12 NOTE — MAU Provider Note (Signed)
History     CSN: [id number]  Arrival date and time: 07/12/22 1944   None     Chief Complaint  Patient presents with   Abdominal Pain   Ms. Payten Hobin is a 32 y.o. year old G56P0010 female at [redacted]w[redacted]d weeks gestation who presents to MAU reporting her mucous plug came out. She reports it came out a little bit last Saturday. She was recently treated for yeast infection, last took medicine last night. She reports a "little bit" of pain. She reports it as tightening and cramping in her abdomen; rated as 4/10. She denies VB or LOF. She reports (+) FM. She receives [facility]; next appt is 07/24/2022.    OB History     Gravida  2   Para      Term      Preterm      AB  1   Living         SAB      IAB  1   Ectopic      Multiple      Live Births              Past Medical History:  Diagnosis Date   Abnormal Pap smear of cervix    Asthma    Eczema    Migraines    Ovarian cyst    Ovarian cyst    left side   STD (sexually transmitted disease)    trichomonas(January, 2023 and April, 2023)    Past Surgical History:  Procedure Laterality Date   NO PAST SURGERIES      Family History  Problem Relation Age of Onset   Bone cancer Mother    Diabetes Mother    Ovarian cancer Maternal Aunt    Diabetes Maternal Aunt    Hypertension Maternal Aunt    Stroke Maternal Grandmother    Diabetes Maternal Grandmother    Hypertension Maternal Grandmother    Heart attack Maternal Grandmother    Breast cancer Maternal Aunt    Diabetes Maternal Aunt    Hypertension Maternal Aunt    Stroke Maternal Aunt    Breast cancer Maternal Aunt    Hypertension Maternal Aunt    Stroke Maternal Aunt     Social History   Tobacco Use   Smoking status: Never   Smokeless tobacco: Never  Vaping Use   Vaping Use: Never used  Substance Use Topics   Alcohol use: Not Currently    Alcohol/week: 1.0 standard drink of alcohol    Types: 1 Glasses of wine per week    Drug use: Never    Allergies:  Allergies  Allergen Reactions   Toradol [Ketorolac Tromethamine] Hives    Medications Prior to Admission  Medication Sig Dispense Refill Last Dose   ondansetron (ZOFRAN-ODT) 4 MG disintegrating tablet Take 1 tablet (4 mg total) by mouth every 8 (eight) hours as needed for nausea or vomiting. 15 tablet 0    Prenatal Vit-Fe Fumarate-FA (MULTIVITAMIN-PRENATAL) 27-0.8 MG TABS tablet Take 1 tablet by mouth daily at 12 noon.       Review of Systems  Constitutional: Negative.   HENT: Negative.    Eyes: Negative.   Respiratory: Negative.    Cardiovascular: Negative.   Gastrointestinal: Negative.   Endocrine: Negative.   Genitourinary:  Positive for vaginal discharge (losing mucous plug).  Musculoskeletal: Negative.   Skin: Negative.   Allergic/Immunologic: Negative.   Neurological: Negative.   Hematological: Negative.   Psychiatric/Behavioral: Negative.  Physical Exam   Blood pressure (!) 111/57, pulse (!) 101, temperature 98.4 F (36.9 C), temperature source Oral, resp. rate 19, height 5' 5.5" (1.664 m), weight 113.1 kg, last menstrual period 12/06/2021, SpO2 100 %.  Physical Exam Vitals and nursing note reviewed. Exam conducted with a chaperone present.  Constitutional:      Appearance: Normal appearance. She is obese.  Cardiovascular:     Rate and Rhythm: Tachycardia present.  Pulmonary:     Effort: Pulmonary effort is normal.  Abdominal:     Palpations: Abdomen is soft.  Genitourinary:    General: Normal vulva.     Comments: Pelvic exam: External genitalia normal, SE: vaginal walls pink and well rugated, cervix is smooth, pink, no lesions, moderate amt of thick. clumpy, white vaginal d/c -- WP, GC/CT done, cervix visually closed, Uterus is non-tender, S=D, no CMT or friability, no adnexal tenderness.  Musculoskeletal:        General: Normal range of motion.  Skin:    General: Skin is warm and dry.  Neurological:     Mental Status:  She is alert and oriented to person, place, and time.  Psychiatric:        Mood and Affect: Mood normal.        Behavior: Behavior normal.        Thought Content: Thought content normal.        Judgment: Judgment normal.    REACTIVE NST - FHR: 135 bpm / moderate variability / accels present / decels absent / TOCO: UI noted   MAU Course  Procedures  MDM CCUA Wet Prep GC/CT -- Results pending  LR bolus  Procardia 10 mg po every 20 mins x 3 doses -- withheld and d/c'd due to low BP  Results for orders placed or performed during the hospital encounter of 07/12/22 (from the past 24 hour(s))  Urinalysis, Routine w reflex microscopic -Urine, Clean Catch     Status: Abnormal   Collection Time: 07/12/22  8:58 PM  Result Value Ref Range   Color, Urine YELLOW YELLOW   APPearance HAZY (A) CLEAR   Specific Gravity, Urine 1.013 1.005 - 1.030   pH 6.0 5.0 - 8.0   Glucose, UA NEGATIVE NEGATIVE mg/dL   Hgb urine dipstick SMALL (A) NEGATIVE   Bilirubin Urine NEGATIVE NEGATIVE   Ketones, ur NEGATIVE NEGATIVE mg/dL   Protein, ur 30 (A) NEGATIVE mg/dL   Nitrite NEGATIVE NEGATIVE   Leukocytes,Ua NEGATIVE NEGATIVE   RBC / HPF 0-5 0 - 5 RBC/hpf   WBC, UA 0-5 0 - 5 WBC/hpf   Bacteria, UA RARE (A) NONE SEEN   Squamous Epithelial / HPF 11-20 0 - 5 /HPF   Mucus PRESENT   Wet prep, genital     Status: Abnormal   Collection Time: 07/12/22  8:58 PM  Result Value Ref Range   Yeast Wet Prep HPF POC NONE SEEN NONE SEEN   Trich, Wet Prep NONE SEEN NONE SEEN   Clue Cells Wet Prep HPF POC PRESENT (A) NONE SEEN   WBC, Wet Prep HPF POC >=10 (A) <10   Sperm NONE SEEN     Assessment and Plan  1. Bacterial vaginosis - Information provided on BV - Rx: Flagyl 500 mg po BID x 7 days   2. Vaginal discharge during pregnancy in third trimester - Reassurance given that no mucous  was seen in vagina  3. [redacted] weeks gestation of pregnancy   - Discharge patient - Keep scheduled appt with [facility]  on 07/24/2022 -  Patient verbalized an understanding of the plan of care and agrees.   Raelyn Mora, CNM 07/12/2022, 9:16 PM

## 2022-07-12 NOTE — MAU Note (Signed)
.  Patty Coffey is a 32 y.o. at [redacted]w[redacted]d here in MAU reporting: mucous plug came out; "a little bit of pain too" - tightening/cramping in abdomen. Denies VB or LOF. +FM  Onset of complaint: 1915 Pain score: 4 Vitals:   07/12/22 2000  BP: 121/71  Pulse: 93  Resp: 19  Temp: 98.4 F (36.9 C)  SpO2: 100%     FHT:132 Lab orders placed from triage:  UA

## 2022-07-13 LAB — GC/CHLAMYDIA PROBE AMP (~~LOC~~) NOT AT ARMC
Chlamydia: NEGATIVE
Comment: NEGATIVE
Comment: NORMAL
Neisseria Gonorrhea: NEGATIVE

## 2022-07-24 ENCOUNTER — Encounter (HOSPITAL_COMMUNITY): Payer: Self-pay | Admitting: Obstetrics and Gynecology

## 2022-07-24 ENCOUNTER — Inpatient Hospital Stay (HOSPITAL_BASED_OUTPATIENT_CLINIC_OR_DEPARTMENT_OTHER): Payer: 59

## 2022-07-24 ENCOUNTER — Inpatient Hospital Stay (HOSPITAL_COMMUNITY)
Admission: AD | Admit: 2022-07-24 | Discharge: 2022-07-24 | Disposition: A | Discharge status: Home / Self Care | Payer: 59 | Attending: Obstetrics and Gynecology | Admitting: Obstetrics and Gynecology

## 2022-07-24 DIAGNOSIS — Z3689 Encounter for other specified antenatal screening: Secondary | ICD-10-CM

## 2022-07-24 DIAGNOSIS — O288 Other abnormal findings on antenatal screening of mother: Secondary | ICD-10-CM

## 2022-07-24 DIAGNOSIS — O99513 Diseases of the respiratory system complicating pregnancy, third trimester: Secondary | ICD-10-CM | POA: Insufficient documentation

## 2022-07-24 DIAGNOSIS — E669 Obesity, unspecified: Secondary | ICD-10-CM

## 2022-07-24 DIAGNOSIS — Z3A34 34 weeks gestation of pregnancy: Secondary | ICD-10-CM | POA: Diagnosis not present

## 2022-07-24 DIAGNOSIS — J45909 Unspecified asthma, uncomplicated: Secondary | ICD-10-CM | POA: Diagnosis not present

## 2022-07-24 DIAGNOSIS — O99213 Obesity complicating pregnancy, third trimester: Secondary | ICD-10-CM | POA: Insufficient documentation

## 2022-07-24 NOTE — MAU Provider Note (Signed)
History     CSN: [id number]  Arrival date and time: 07/24/22 1019   Event Date/Time   First Provider Initiated Contact with Patient 07/24/22 1051      Chief Complaint  Patient presents with   Non-stress Test   HPI  [name] is a 32 y.o. female G2P0010 @ [redacted]w[redacted]d here in MAU for further monitoring. She  had an NST in the office today d/t obesity. She has starting weekly antenatal testing in the office. NST was non reactive. She was sent to the MAU for further monitoring and possibly BPP  OB History     Gravida  2   Para      Term      Preterm      AB  1   Living         SAB      IAB  1   Ectopic      Multiple      Live Births              Past Medical History:  Diagnosis Date   Abnormal Pap smear of cervix    Asthma    Eczema    Migraines    Ovarian cyst    Ovarian cyst    left side   STD (sexually transmitted disease)    trichomonas(January, 2023 and April, 2023)    Past Surgical History:  Procedure Laterality Date   NO PAST SURGERIES      Family History  Problem Relation Age of Onset   Bone cancer Mother    Diabetes Mother    Ovarian cancer Maternal Aunt    Diabetes Maternal Aunt    Hypertension Maternal Aunt    Stroke Maternal Grandmother    Diabetes Maternal Grandmother    Hypertension Maternal Grandmother    Heart attack Maternal Grandmother    Breast cancer Maternal Aunt    Diabetes Maternal Aunt    Hypertension Maternal Aunt    Stroke Maternal Aunt    Breast cancer Maternal Aunt    Hypertension Maternal Aunt    Stroke Maternal Aunt     Social History   Tobacco Use   Smoking status: Never   Smokeless tobacco: Never  Vaping Use   Vaping Use: Never used  Substance Use Topics   Alcohol use: Not Currently    Alcohol/week: 1.0 standard drink of alcohol    Types: 1 Glasses of wine per week   Drug use: Never    Allergies:  Allergies  Allergen Reactions   Toradol [Ketorolac Tromethamine] Hives     Medications Prior to Admission  Medication Sig Dispense Refill Last Dose   Prenatal Vit-Fe Fumarate-FA (MULTIVITAMIN-PRENATAL) 27-0.8 MG TABS tablet Take 1 tablet by mouth daily at 12 noon.   07/24/2022   metroNIDAZOLE (FLAGYL) 500 MG tablet Take 1 tablet (500 mg total) by mouth 2 (two) times daily. 14 tablet 0    ondansetron (ZOFRAN-ODT) 4 MG disintegrating tablet Take 1 tablet (4 mg total) by mouth every 8 (eight) hours as needed for nausea or vomiting. 15 tablet 0    No results found for this or any previous visit (from the past 48 hour(s)).   Review of Systems  Gastrointestinal:  Negative for abdominal pain.  Genitourinary:  Negative for vaginal bleeding and vaginal discharge.   Physical Exam   Blood pressure 122/61, pulse 98, resp. rate 16, last menstrual period 12/06/2021, SpO2 100 %.  Patient Vitals for the past 24 hrs:  BP Pulse Resp  SpO2  07/24/22 1257 126/70 93 -- --  07/24/22 1050 122/61 98 -- --  07/24/22 1047 128/67 99 16 100 %     Physical Exam Constitutional:      General: She is not in acute distress.    Appearance: Normal appearance. She is not ill-appearing, toxic-appearing or diaphoretic.  Abdominal:     Palpations: Abdomen is soft.     Tenderness: There is no abdominal tenderness.  Musculoskeletal:        General: Normal range of motion.  Skin:    General: Skin is warm.  Neurological:     Mental Status: She is alert and oriented to person, place, and time.  Psychiatric:        Behavior: Behavior normal.     MAU Course  Procedures  MDM  Initially fetal tracing with 10x10, moderate variability, no decels, no 15x15 BPP 8/8, NST now with 15x15, baseline 130, BPP 10/10  Assessment and Plan   A:  1. NST (non-stress test) reactive   2. [redacted] weeks gestation of pregnancy      P:  Dc home Strict return precautions Kick counts reviewed Follow up with OB as scheduled.  Venia Carbon I, NP 07/24/2022 2:04 PM

## 2022-07-24 NOTE — MAU Note (Signed)
.  Patty Coffey is a 32 y.o. at [redacted]w[redacted]d here in MAU reporting: sent from office for non reactive NST. Having NST for PIH. Reports occasional mild cramping that is not new. Feels regular fetal movement denies any vag bleeding or discharge Onset of complaint: today  Pain score: 0 There were no vitals filed for this visit.   FHT: Lab orders placed from triage:

## 2022-07-28 DIAGNOSIS — O99213 Obesity complicating pregnancy, third trimester: Secondary | ICD-10-CM | POA: Diagnosis not present

## 2022-07-28 DIAGNOSIS — Z3A34 34 weeks gestation of pregnancy: Secondary | ICD-10-CM | POA: Diagnosis not present

## 2022-07-31 ENCOUNTER — Inpatient Hospital Stay (HOSPITAL_BASED_OUTPATIENT_CLINIC_OR_DEPARTMENT_OTHER): Payer: 59

## 2022-07-31 ENCOUNTER — Encounter (HOSPITAL_COMMUNITY): Payer: Self-pay | Admitting: Obstetrics and Gynecology

## 2022-07-31 ENCOUNTER — Inpatient Hospital Stay (HOSPITAL_COMMUNITY)
Admission: AD | Admit: 2022-07-31 | Discharge: 2022-07-31 | Disposition: A | Discharge status: Home / Self Care | Payer: 59 | Attending: Obstetrics and Gynecology | Admitting: Obstetrics and Gynecology

## 2022-07-31 ENCOUNTER — Other Ambulatory Visit: Payer: Self-pay

## 2022-07-31 DIAGNOSIS — O26893 Other specified pregnancy related conditions, third trimester: Secondary | ICD-10-CM | POA: Insufficient documentation

## 2022-07-31 DIAGNOSIS — Z3689 Encounter for other specified antenatal screening: Secondary | ICD-10-CM

## 2022-07-31 DIAGNOSIS — O99213 Obesity complicating pregnancy, third trimester: Secondary | ICD-10-CM | POA: Insufficient documentation

## 2022-07-31 DIAGNOSIS — O288 Other abnormal findings on antenatal screening of mother: Secondary | ICD-10-CM

## 2022-07-31 DIAGNOSIS — E669 Obesity, unspecified: Secondary | ICD-10-CM

## 2022-07-31 DIAGNOSIS — Z3A35 35 weeks gestation of pregnancy: Secondary | ICD-10-CM

## 2022-07-31 DIAGNOSIS — R030 Elevated blood-pressure reading, without diagnosis of hypertension: Secondary | ICD-10-CM | POA: Diagnosis not present

## 2022-07-31 DIAGNOSIS — Z3A34 34 weeks gestation of pregnancy: Secondary | ICD-10-CM | POA: Diagnosis not present

## 2022-07-31 NOTE — MAU Provider Note (Signed)
History     [id number]  Arrival date and time: 07/31/22 1324    Chief Complaint  Patient presents with   sent from office for bpp     HPI Sreeya Rossiter is a 32 y.o. G2P0010 at [redacted]w[redacted]d who presents for fetal monitoring. Was in OB office today & had non reactive NST. Sent over for monitoring & BPP. She reports normal fetal movement. Denies abdominal pain, LOF, or vaginal bleeding.  Denies history of hypertension. Denies headache, visual disturbance, or epigastric pain. Had elevated BP in office today - preeclampsia labs drawn in office before she was sent over.    --/--/A POS (03/06 1925)  OB History     Gravida  2   Para      Term      Preterm      AB  1   Living         SAB      IAB  1   Ectopic      Multiple      Live Births              Past Medical History:  Diagnosis Date   Abnormal Pap smear of cervix    Asthma    Eczema    Migraines    Ovarian cyst    left side   STD (sexually transmitted disease)    trichomonas(January, 2023 and April, 2023)    Past Surgical History:  Procedure Laterality Date   NO PAST SURGERIES      Family History  Problem Relation Age of Onset   Bone cancer Mother    Diabetes Mother    Ovarian cancer Maternal Aunt    Diabetes Maternal Aunt    Hypertension Maternal Aunt    Stroke Maternal Grandmother    Diabetes Maternal Grandmother    Hypertension Maternal Grandmother    Heart attack Maternal Grandmother    Breast cancer Maternal Aunt    Diabetes Maternal Aunt    Hypertension Maternal Aunt    Stroke Maternal Aunt    Breast cancer Maternal Aunt    Hypertension Maternal Aunt    Stroke Maternal Aunt     Social History   Socioeconomic History   Marital status: Single    Spouse name: Not on file   Number of children: Not on file   Years of education: Not on file   Highest education level: Not on file  Occupational History   Not on file  Tobacco Use   Smoking status: Never   Smokeless tobacco: Never   Vaping Use   Vaping Use: Never used  Substance and Sexual Activity   Alcohol use: Not Currently    Alcohol/week: 1.0 standard drink of alcohol    Types: 1 Glasses of wine per week   Drug use: Never   Sexual activity: Not Currently    Partners: Male    Birth control/protection: None    Comment: First IC?, Hx of +TV  Other Topics Concern   Not on file  Social History Narrative   Not on file   Social Determinants of Health   Financial Resource Strain: Not on file  Food Insecurity: Not on file  Transportation Needs: Not on file  Physical Activity: Not on file  Stress: Not on file  Social Connections: Not on file  Intimate Partner Violence: Not on file    Allergies  Allergen Reactions   Bee Pollen Itching   Toradol [Ketorolac Tromethamine] Hives    No current facility-administered  medications on file prior to encounter.   Current Outpatient Medications on File Prior to Encounter  Medication Sig Dispense Refill   Cholecalciferol (VITAMIN D) 50 MCG (2000 UT) CAPS Take 1 capsule by mouth in the morning and at bedtime.     ferrous sulfate 324 MG TBEC Take 324 mg by mouth every other day.     Prenatal Vit-Fe Fumarate-FA (MULTIVITAMIN-PRENATAL) 27-0.8 MG TABS tablet Take 1 tablet by mouth every other day.       ROS Pertinent positives and negative per HPI, all others reviewed and negative  Physical Exam   BP 124/62   Pulse 84   Temp 98.2 F (36.8 C) (Oral)   Resp 17   Ht 5' 5.5" (1.664 m)   Wt 119.2 kg   LMP 12/06/2021   SpO2 100%   BMI 43.05 kg/m   Patient Vitals for the past 24 hrs:  BP Temp Temp src Pulse Resp SpO2 Height Weight  07/31/22 1622 124/62 -- -- 84 -- -- -- --  07/31/22 1501 132/75 -- -- 83 -- -- -- --  07/31/22 1431 136/73 -- -- 83 -- -- -- --  07/31/22 1401 131/68 -- -- 82 -- -- -- --  07/31/22 1346 (!) 125/55 -- -- 87 -- -- -- --  07/31/22 1342 (!) 140/70 98.2 F (36.8 C) Oral 89 17 100 % 5' 5.5" (1.664 m) 119.2 kg    Physical  Exam Vitals and nursing note reviewed.  Constitutional:      General: She is not in acute distress.    Appearance: She is well-developed. She is not ill-appearing.  HENT:     Head: Normocephalic and atraumatic.  Eyes:     General: No scleral icterus.       Right eye: No discharge.        Left eye: No discharge.     Conjunctiva/sclera: Conjunctivae normal.  Pulmonary:     Effort: Pulmonary effort is normal. No respiratory distress.  Neurological:     General: No focal deficit present.     Mental Status: She is alert.  Psychiatric:        Mood and Affect: Mood normal.        Behavior: Behavior normal.       FHT Baseline 130, moderate variability, 15x15 accels, no decels Toco: irregular Cat: 1  Labs No results found for this or any previous visit (from the past 24 hour(s)).  Imaging [place] MFM FETAL BPP WO NON STRESS  Result Date: 07/31/2022 ----------------------------------------------------------------------  OBSTETRICS REPORT                       (Signed Final 07/31/2022 03:56 pm) ---------------------------------------------------------------------- Patient Info  ID #:       [id number]                          D.O.B.:  26-Dec-1990 (31 yrs)  Name:       Johnanna Schneiders                   Visit Date: 07/31/2022 02:03 pm ---------------------------------------------------------------------- Performed By  Attending:        Ma Rings MD         Ref. Address:     [facility]  Obstetrics &                                                             Gynecology                                                             [address]                                                             [address]                                                             [place], [place]                                                             [address]  Performed By:     Anabel Halon          [facility]  Referred By:      Nigel Bridgeman                    CNM ---------------------------------------------------------------------- Orders  #  Description                           Code        Ordered By  1  [place] MFM FETAL BPP WO NON               62130.86    Jordyan Hardiman     STRESS ----------------------------------------------------------------------  #  Order #                     Accession #  Episode #  1  [id number]                   [id number]                 [id number] ---------------------------------------------------------------------- Indications  Non-reactive NST                               O28.9  Obesity complicating pregnancy, third          O99.213  trimester  [redacted] weeks gestation of pregnancy                Z3A.35 ---------------------------------------------------------------------- Fetal Evaluation  Num Of Fetuses:         1  Fetal Heart Rate(bpm):  137  Cardiac Activity:       Observed  Presentation:           Cephalic  Placenta:               Left lateral  P. Cord Insertion:      Not well visualized  Amniotic Fluid  AFI FV:      Within normal limits  AFI Sum(cm)     %Tile       Largest Pocket(cm)  11              28          5.7  RUQ(cm)       RLQ(cm)       LUQ(cm)        LLQ(cm)  1.9           0             3.4            5.7 ---------------------------------------------------------------------- Biophysical Evaluation  Amniotic F.V:   Pocket => 2 cm             F. Tone:        Not Observed  F. Movement:    Observed                   N.S.T:          Reactive  F. Breathing:   Observed                   Score:          8/10 ---------------------------------------------------------------------- OB History  Gravidity:    2  TOP:          1 ---------------------------------------------------------------------- Gestational Age  LMP:           33w 6d        Date:  12/06/21                   EDD:   09/12/22  Clinical EDD:  35w 1d                                        EDD:   09/03/22  Best:          35w 1d     Det. By:  Clinical EDD             EDD:   09/03/22 ---------------------------------------------------------------------- Anatomy  Diaphragm:             Appears normal         Kidneys:  Appear normal  Stomach:               Appears normal, left   Bladder:                Appears normal                         sided ---------------------------------------------------------------------- Cervix Uterus Adnexa  Cervix  Not visualized (advanced GA >24wks)  Uterus  No abnormality visualized.  Cul De Sac  No free fluid seen.  Adnexa  No abnormality visualized ---------------------------------------------------------------------- Comments  This patient was seen for a BPP after she had a nonreactive  NST in the office.  Her pregnancy has been complicated by  maternal obesity with a BMI of 43.  A biophysical profile performed today was 6/8.  She received  a -2 for absent fetal tone.  She had a reactive NST in the  MAU, making her total BPP today 8 out of 10.  The AFI was 11. cm (within normal limits).  She should continue weekly fetal testing until delivery. ----------------------------------------------------------------------                   Ma Rings, MD Electronically Signed Final Report   07/31/2022 03:56 pm ----------------------------------------------------------------------   MAU Course  Procedures Lab Orders  No laboratory test(s) ordered today   No orders of the defined types were placed in this encounter.  Imaging Orders         [place] MFM FETAL BPP WO NON STRESS     MDM moderate  Assessment and Plan   1. Elevated BP without diagnosis of hypertension   2. NST (non-stress test) reactive   3. [redacted] weeks gestation of pregnancy    -Patient sent from office for fetal monitoring & BPP. Has reactive tracing here in MAU & reports good fetal movement. BPP 6/8,  off for tone, normal AFI. Per Dr. Parke Poisson, patient should started antenatal testing next week. Rhea Pink ([facility] CNM) notified of patient's need for BPP next week.  -Intake BP elevated, repeats normal. Patient asymptomatic. Preeclampsia labs drawn in office today. Per her OB's request, will send home with 24 hour urine jug. Patient will start 24 hour urine Sunday morning so she can take it to the office on Monday.  -Reviewed reasons to return to MAU.    #FWB: cat 1 tracing    Dispo: discharged to home in stable condition.   Discharge Instructions     Discharge patient   Complete by: As directed    Discharge disposition: 01-Home or Self Care   Discharge patient date: 07/31/2022       Judeth Horn, NP 07/31/22 5:33 PM  Allergies as of 07/31/2022       Reactions   Bee Pollen Itching   Toradol [ketorolac Tromethamine] Hives        Medication List     STOP taking these medications    metroNIDAZOLE 500 MG tablet Commonly known as: FLAGYL   ondansetron 4 MG disintegrating tablet Commonly known as: ZOFRAN-ODT       TAKE these medications    ferrous sulfate 324 MG Tbec Take 324 mg by mouth every other day.   multivitamin-prenatal 27-0.8 MG Tabs tablet Take 1 tablet by mouth every other day.   Vitamin D 50 MCG (2000 UT) Caps Take 1 capsule by mouth in the morning and at bedtime.

## 2022-07-31 NOTE — MAU Note (Signed)
31yo [redacted]w[redacted]d G2P0 sent from office for BPP after nonreactive NST. Denies contractions, leaking of fluid, bleeding, and s/s of preeclampsia. Endorses good fetal movement.

## 2022-08-06 DIAGNOSIS — Z331 Pregnant state, incidental: Secondary | ICD-10-CM | POA: Diagnosis not present

## 2022-08-06 DIAGNOSIS — Z3A36 36 weeks gestation of pregnancy: Secondary | ICD-10-CM | POA: Diagnosis not present

## 2022-08-06 DIAGNOSIS — Z113 Encounter for screening for infections with a predominantly sexual mode of transmission: Secondary | ICD-10-CM | POA: Diagnosis not present

## 2022-08-06 DIAGNOSIS — Z364 Encounter for antenatal screening for fetal growth retardation: Secondary | ICD-10-CM | POA: Diagnosis not present

## 2022-08-06 DIAGNOSIS — D649 Anemia, unspecified: Secondary | ICD-10-CM | POA: Diagnosis not present

## 2022-08-06 DIAGNOSIS — O3663X0 Maternal care for excessive fetal growth, third trimester, not applicable or unspecified: Secondary | ICD-10-CM | POA: Diagnosis not present

## 2022-08-06 DIAGNOSIS — R809 Proteinuria, unspecified: Secondary | ICD-10-CM | POA: Diagnosis not present

## 2022-08-06 DIAGNOSIS — R7309 Other abnormal glucose: Secondary | ICD-10-CM | POA: Diagnosis not present

## 2022-08-06 DIAGNOSIS — E559 Vitamin D deficiency, unspecified: Secondary | ICD-10-CM | POA: Diagnosis not present

## 2022-08-06 DIAGNOSIS — Z369 Encounter for antenatal screening, unspecified: Secondary | ICD-10-CM | POA: Diagnosis not present

## 2022-08-06 DIAGNOSIS — J45909 Unspecified asthma, uncomplicated: Secondary | ICD-10-CM | POA: Diagnosis not present

## 2022-08-06 DIAGNOSIS — Z889 Allergy status to unspecified drugs, medicaments and biological substances status: Secondary | ICD-10-CM | POA: Diagnosis not present

## 2022-08-06 DIAGNOSIS — O99211 Obesity complicating pregnancy, first trimester: Secondary | ICD-10-CM | POA: Diagnosis not present

## 2022-08-06 LAB — OB RESULTS CONSOLE GBS: GBS: NEGATIVE

## 2022-08-11 ENCOUNTER — Encounter: Payer: Self-pay | Admitting: *Deleted

## 2022-08-11 ENCOUNTER — Ambulatory Visit: Payer: 59 | Attending: Maternal & Fetal Medicine

## 2022-08-11 ENCOUNTER — Ambulatory Visit (HOSPITAL_BASED_OUTPATIENT_CLINIC_OR_DEPARTMENT_OTHER): Payer: 59 | Admitting: Obstetrics

## 2022-08-11 ENCOUNTER — Ambulatory Visit: Payer: 59 | Admitting: *Deleted

## 2022-08-11 DIAGNOSIS — O1493 Unspecified pre-eclampsia, third trimester: Secondary | ICD-10-CM | POA: Diagnosis present

## 2022-08-11 DIAGNOSIS — E669 Obesity, unspecified: Secondary | ICD-10-CM

## 2022-08-11 DIAGNOSIS — O1413 Severe pre-eclampsia, third trimester: Secondary | ICD-10-CM | POA: Insufficient documentation

## 2022-08-11 DIAGNOSIS — Z3A36 36 weeks gestation of pregnancy: Secondary | ICD-10-CM | POA: Insufficient documentation

## 2022-08-11 DIAGNOSIS — O99213 Obesity complicating pregnancy, third trimester: Secondary | ICD-10-CM

## 2022-08-11 NOTE — Progress Notes (Signed)
MFM Note  Patty Coffey was seen due to possible preeclampsia.  The patient was seen in the MAU due to a nonreactive NST in the office on Jul 31, 2022.  During her MAU visit, she was noted to have mildly elevated blood pressures.  She was discharged home from the MAU with instructions to collect a 24-hour urine.  The patient reports that her 24-hour urine showed over 1300 mg of protein.  She reports that her PIH labs were all within normal limits.  I could not find any documentation of this in Epic.  Her blood pressure in our office today was 128/84.  She denies any signs or symptoms of preeclampsia.  She denies any other problems in her current pregnancy.  On today's exam, the overall EFW of 7 pounds 12 ounces measures at the 92nd percentile for her gestational age.    Normal amniotic fluid with a total AFI of 7.44 cm is noted.    Fetal movements and fetal breathing movements were noted throughout today's exam.  The fetus is in the vertex presentation.  The patient was advised that should her 24-hour urine indicate significant proteinuria, it would mean that she has developed preeclampsia and therefore delivery is recommended at between 37 to 38 weeks.  The patient will discuss scheduling an induction with you during her prenatal visit later this week.    Preeclampsia precautions were reviewed.  She was advised to go to the hospital should she experience any signs or symptoms of preeclampsia.  The patient is happy and comfortable with delivery at between 37 to 38 weeks.    She stated that all of her questions were answered today.  A total of 20 minutes was spent counseling and coordinating the care for this patient.  Greater than 50% of the time was spent in direct face-to-face contact.

## 2022-08-13 ENCOUNTER — Telehealth (HOSPITAL_COMMUNITY): Payer: Self-pay | Admitting: *Deleted

## 2022-08-13 NOTE — Telephone Encounter (Signed)
Preadmission screen  

## 2022-08-14 ENCOUNTER — Encounter (HOSPITAL_COMMUNITY): Payer: Self-pay | Admitting: *Deleted

## 2022-08-18 ENCOUNTER — Encounter (HOSPITAL_COMMUNITY): Payer: Self-pay | Admitting: Obstetrics and Gynecology

## 2022-08-18 ENCOUNTER — Other Ambulatory Visit: Payer: Self-pay

## 2022-08-18 ENCOUNTER — Inpatient Hospital Stay (HOSPITAL_COMMUNITY)
Admission: RE | Admit: 2022-08-18 | Discharge: 2022-08-21 | DRG: 806 | Disposition: A | Discharge status: Home / Self Care | Payer: 59 | Attending: Obstetrics and Gynecology | Admitting: Obstetrics and Gynecology

## 2022-08-18 DIAGNOSIS — O9081 Anemia of the puerperium: Secondary | ICD-10-CM | POA: Diagnosis not present

## 2022-08-18 DIAGNOSIS — O1404 Mild to moderate pre-eclampsia, complicating childbirth: Principal | ICD-10-CM | POA: Diagnosis present

## 2022-08-18 DIAGNOSIS — O99214 Obesity complicating childbirth: Secondary | ICD-10-CM | POA: Diagnosis present

## 2022-08-18 DIAGNOSIS — O1494 Unspecified pre-eclampsia, complicating childbirth: Secondary | ICD-10-CM | POA: Diagnosis present

## 2022-08-18 DIAGNOSIS — D62 Acute posthemorrhagic anemia: Secondary | ICD-10-CM | POA: Diagnosis not present

## 2022-08-18 DIAGNOSIS — Z3A37 37 weeks gestation of pregnancy: Secondary | ICD-10-CM | POA: Diagnosis not present

## 2022-08-18 DIAGNOSIS — O1490 Unspecified pre-eclampsia, unspecified trimester: Principal | ICD-10-CM | POA: Diagnosis present

## 2022-08-18 LAB — CBC
HCT: 30.6 % — ABNORMAL LOW (ref 36.0–46.0)
Hemoglobin: 9.5 g/dL — ABNORMAL LOW (ref 12.0–15.0)
MCH: 24.2 pg — ABNORMAL LOW (ref 26.0–34.0)
MCHC: 31 g/dL (ref 30.0–36.0)
MCV: 78.1 fL — ABNORMAL LOW (ref 80.0–100.0)
Platelets: 296 10*3/uL (ref 150–400)
RBC: 3.92 MIL/uL (ref 3.87–5.11)
RDW: 15.7 % — ABNORMAL HIGH (ref 11.5–15.5)
WBC: 8.1 10*3/uL (ref 4.0–10.5)
nRBC: 0 % (ref 0.0–0.2)

## 2022-08-18 LAB — COMPREHENSIVE METABOLIC PANEL
ALT: 14 U/L (ref 0–44)
AST: 22 U/L (ref 15–41)
Albumin: 2.4 g/dL — ABNORMAL LOW (ref 3.5–5.0)
Alkaline Phosphatase: 173 U/L — ABNORMAL HIGH (ref 38–126)
Anion gap: 9 (ref 5–15)
BUN: 10 mg/dL (ref 6–20)
CO2: 20 mmol/L — ABNORMAL LOW (ref 22–32)
Calcium: 9 mg/dL (ref 8.9–10.3)
Chloride: 107 mmol/L (ref 98–111)
Creatinine, Ser: 0.84 mg/dL (ref 0.44–1.00)
GFR, Estimated: >60 mL/min (ref >60)
Glucose, Bld: 63 mg/dL — ABNORMAL LOW (ref 70–99)
Potassium: 3.9 mmol/L (ref 3.5–5.1)
Sodium: 136 mmol/L (ref 135–145)
Total Bilirubin: 0.4 mg/dL (ref 0.3–1.2)
Total Protein: 5.8 g/dL — ABNORMAL LOW (ref 6.5–8.1)

## 2022-08-18 LAB — PROTEIN / CREATININE RATIO, URINE
Creatinine, Urine: 242 mg/dL
Protein Creatinine Ratio: 2.16 mg/mg{Cre} — ABNORMAL HIGH (ref 0.00–0.15)
Total Protein, Urine: 522 mg/dL

## 2022-08-18 LAB — RPR: RPR Ser Ql: NONREACTIVE

## 2022-08-18 LAB — TYPE AND SCREEN

## 2022-08-18 MED ORDER — OXYCODONE-ACETAMINOPHEN 5-325 MG PO TABS: 2.0000 | ORAL_TABLET | ORAL | Status: DC | PRN

## 2022-08-18 MED ORDER — MISOPROSTOL 25 MCG QUARTER TABLET
25.0000 ug | ORAL_TABLET | Freq: Once | ORAL | Status: AC
  Administered 2022-08-18: 25 ug via ORAL
  Filled 2022-08-18: qty 1

## 2022-08-18 MED ORDER — OXYTOCIN-SODIUM CHLORIDE 30-0.9 UT/500ML-% IV SOLN
1.0000 m[IU]/min | INTRAVENOUS | Status: DC
  Administered 2022-08-18: 2 m[IU]/min via INTRAVENOUS
  Filled 2022-08-18: qty 500

## 2022-08-18 MED ORDER — FENTANYL CITRATE (PF) 100 MCG/2ML IJ SOLN
50.0000 ug | INTRAMUSCULAR | Status: DC | PRN
  Administered 2022-08-18 – 2022-08-19 (×2): 100 ug via INTRAVENOUS
  Filled 2022-08-18 (×2): qty 2

## 2022-08-18 MED ORDER — ONDANSETRON HCL 4 MG/2ML IJ SOLN: 4.0000 mg | Freq: Four times a day (QID) | INTRAMUSCULAR | Status: DC | PRN

## 2022-08-18 MED ORDER — LIDOCAINE HCL (PF) 1 % IJ SOLN
30.0000 mL | INTRAMUSCULAR | Status: AC | PRN
  Administered 2022-08-19: 30 mL via SUBCUTANEOUS
  Filled 2022-08-18: qty 30

## 2022-08-18 MED ORDER — OXYTOCIN BOLUS FROM INFUSION
333.0000 mL | Freq: Once | INTRAVENOUS | Status: AC
  Administered 2022-08-19: 333 mL via INTRAVENOUS

## 2022-08-18 MED ORDER — ACETAMINOPHEN 325 MG PO TABS: 650.0000 mg | ORAL_TABLET | ORAL | Status: DC | PRN

## 2022-08-18 MED ORDER — TERBUTALINE SULFATE 1 MG/ML IJ SOLN: 0.2500 mg | Freq: Once | INTRAMUSCULAR | Status: DC | PRN

## 2022-08-18 MED ORDER — MISOPROSTOL 25 MCG QUARTER TABLET
25.0000 ug | ORAL_TABLET | Freq: Once | ORAL | Status: AC
  Administered 2022-08-18: 25 ug via VAGINAL
  Filled 2022-08-18: qty 1

## 2022-08-18 MED ORDER — LACTATED RINGERS IV SOLN: 500.0000 mL | INTRAVENOUS | Status: DC | PRN

## 2022-08-18 MED ORDER — OXYCODONE-ACETAMINOPHEN 5-325 MG PO TABS: 1.0000 | ORAL_TABLET | ORAL | Status: DC | PRN

## 2022-08-18 MED ORDER — LACTATED RINGERS IV SOLN: INTRAVENOUS | Status: DC

## 2022-08-18 MED ORDER — SOD CITRATE-CITRIC ACID 500-334 MG/5ML PO SOLN: 30.0000 mL | ORAL | Status: DC | PRN

## 2022-08-18 MED ORDER — OXYTOCIN-SODIUM CHLORIDE 30-0.9 UT/500ML-% IV SOLN
2.5000 [IU]/h | INTRAVENOUS | Status: DC
  Administered 2022-08-19: 2.5 [IU]/h via INTRAVENOUS

## 2022-08-18 NOTE — Progress Notes (Signed)
  Labor Progress Note  Patty Coffey is a 32 y.o. female, G2P0010, IUP at 37.5 weeks, presenting for Preeclampsia with elevated BP, creatinine in the office of 1.06, and last night saw spots. Obesity 41 BMI at NOB. Vit D def, and anemia.   Subjective: Pt stable, feeling contraction but tolerating well, denies HA, RUQ pain or vision changes. Reviewed next steps of AROM and pitocin. Reviewed R/B/A.  Patient Active Problem List   Diagnosis Date Noted   Preeclampsia 08/18/2022   Obesity affecting pregnancy 06/29/2022    Objective: BP (!) 144/81   Pulse 91   Temp 98.1 F (36.7 C) (Oral)   Resp 18   Ht 5' 5.5" (1.664 m)   Wt 118.8 kg   LMP 12/06/2021   SpO2 100%   BMI 42.94 kg/m  No intake/output data recorded. No intake/output data recorded. NST: FHR baseline 140 bpm, Variability: moderate, Accelerations:present, Decelerations:  Absent= Cat 1/Reactive CTX:  irregular, every 2-6 minutes Uterus gravid, soft non tender, moderate to palpate with contractions.  SVE:  Dilation: 4 Effacement (%): 90 Station: -2 Exam by:: Martinique Pizzimenti CNM Pitocin at 0 mUn/min AROM, clear tolerated well.   Assessment:  Patty Coffey is a 32 y.o. female, G2P0010, IUP at 37.5 weeks, presenting for Preeclampsia with elevated BP, creatinine in the office of 1.06, and last night saw spots. Obesity 41 BMI at NOB. Vit D def, and anemia.  Progressing in latent labor  Patient Active Problem List   Diagnosis Date Noted   Preeclampsia 08/18/2022   Obesity affecting pregnancy 06/29/2022   NICHD: Category 1  Membranes:  AROM, clear @1446  on 6/4, no s/s of infection  Induction:    Cytotec x25/25 PO /VA @ 1009  Foley Bulb: Not placed.    Pitocin - 0  Pain management:               IV pain management: x PRN  Nitrous: PRN             Epidural placement: PRN but desires all natural.   GBS Negative  Preeclampsia: asymptomatic currently, BP 144/81, PCR  Pending , creatinine 0.84, AST/ALT 22/14, plat  296  Plan: Continue labor plan Continuous monitoring Rest Ambulate Frequent position changes to facilitate fetal rotation and descent. Will reassess with cervical exam at 4 hours or earlier if necessary Start pitocin 2x2 per per protocol if cxt dont pick up.  PreE: pending PCR,  monitor BP, if SR >160/110 will start magnesium. IV labetalol protocol if indicated.  Anticipate labor progression and vaginal delivery.   [facility] CNM, FNP-C, PMHNP-BC  [id number]  [place], [place] [address]  Cell: [phone]  Office Phone: [phone] Fax: [phone] 08/18/2022  3:23 PM

## 2022-08-18 NOTE — Progress Notes (Signed)
  Labor Progress Note  Patty Coffey is a 32 y.o. female, G2P0010, IUP at 37.5 weeks, presenting for Preeclampsia with elevated BP, creatinine in the office of 1.06, and last night saw spots, PCR was 1.3 in the office . Obesity 41 BMI at NOB. Vit D def, and anemia.   Subjective: Pt feeling contraction more intense, pt stable, lots of support in the room. Still desires to go all natural  Patient Active Problem List   Diagnosis Date Noted   Preeclampsia 08/18/2022   Obesity affecting pregnancy 06/29/2022   Objective: BP (!) 146/95   Pulse 85   Temp 98.1 F (36.7 C) (Oral)   Resp 18   Ht 5' 5.5" (1.664 m)   Wt 118.8 kg   LMP 12/06/2021   SpO2 100%   BMI 42.94 kg/m  No intake/output data recorded. No intake/output data recorded. NST: FHR baseline 130 bpm, Variability: moderate, Accelerations:present, Decelerations:  Absent= Cat 1/Reactive CTX:  irregular, every 2-5 minutes Uterus gravid, soft non tender, moderate to palpate with contractions.  SVE:  Dilation: 5 Effacement (%): 90 Station: -1 Exam by:: Caelie Remsburg CNM Pitocin at 2 mUn/min AROM, clear tolerated well.  Bloody show noted.  Assessment:  Patty Coffey is a 32 y.o. female, G2P0010, IUP at 37.5 weeks, presenting for Preeclampsia with elevated BP, creatinine in the office of 1.06, and last night saw spots. PCR was 1.3. Obesity 41 BMI at NOB. Vit D def, and anemia.  Progressing in latent labor , started on pitocin  Patient Active Problem List   Diagnosis Date Noted   Preeclampsia 08/18/2022   Obesity affecting pregnancy 06/29/2022   NICHD: Category 1  Membranes:  AROM, clear @1446  on 6/4, no s/s of infection  Induction:    Cytotec x25/25 PO /VA @ 1009  Foley Bulb: Not placed.    Pitocin - 2  Pain management:               IV pain management: x PRN  Nitrous: PRN             Epidural placement: PRN but desires all natural.   GBS Negative  Preeclampsia: asymptomatic currently, BP 148/91, PCR  2.16 , creatinine  0.84, AST/ALT 22/14, plat 296  Plan: Continue labor plan Continuous monitoring Rest Ambulate Frequent position changes to facilitate fetal rotation and descent. Will reassess with cervical exam at 4 hours or earlier if necessary Continue pitocin 2x2 per per protocol if cxt dont pick up.  PreE: pending PCR,  monitor BP, if SR >160/110 will start magnesium. IV labetalol protocol if indicated. Mag PP.  Anticipate labor progression and vaginal delivery.  EBT: 2127  [facility] CNM, FNP-C, PMHNP-BC  [address]  [place], [place] [address]  Cell: [phone]  Office Phone: [phone] Fax: [phone] 08/18/2022  6:11 PM

## 2022-08-18 NOTE — Progress Notes (Signed)
Patient ID: Patty Coffey, female   DOB: Feb 19, 1991, 32 y.o.   MRN: [id number] Pt does not want an epidural BP (!) 156/79   Pulse 84   Temp 98.1 F (36.7 C) (Oral)   Resp 18   Ht 5' 5.5" (1.664 m)   Wt 118.8 kg   LMP 12/06/2021   SpO2 100%   BMI 42.94 kg/m  Cat 1 Toco q 2 min Cx unchanged per RN Continue to increase pitocin.   Anticipate SVD

## 2022-08-18 NOTE — H&P (Signed)
Patty Coffey is a 32 y.o. female, G2P0010, IUP at 37.5 weeks, presenting for Preeclampsia with elevated BP, creatinine in the office of 1.06, and last night saw spots. Obesity 41 BMI at NOB. Vit D def, and anemia.  Pt endorse + Fm. Denies vaginal leakage. Denies vaginal bleeding. Denies feeling cxt's.   Patient Active Problem List   Diagnosis Date Noted   Preeclampsia 08/18/2022   Obesity affecting pregnancy 06/29/2022     Active Ambulatory Problems    Diagnosis Date Noted   Obesity affecting pregnancy 06/29/2022   Resolved Ambulatory Problems    Diagnosis Date Noted   No Resolved Ambulatory Problems   Past Medical History:  Diagnosis Date   Abnormal Pap smear of cervix    Asthma    Eczema    Family history of adverse reaction to anesthesia    Migraines    Ovarian cyst    Pre-eclampsia    STD (sexually transmitted disease)       Medications Prior to Admission  Medication Sig Dispense Refill Last Dose   Cholecalciferol (VITAMIN D) 50 MCG (2000 UT) CAPS Take 1 capsule by mouth in the morning and at bedtime.      ferrous sulfate 324 MG TBEC Take 324 mg by mouth every other day.      Prenatal Vit-Fe Fumarate-FA (MULTIVITAMIN-PRENATAL) 27-0.8 MG TABS tablet Take 1 tablet by mouth every other day.       Past Medical History:  Diagnosis Date   Abnormal Pap smear of cervix    Asthma    Eczema    Family history of adverse reaction to anesthesia    aunt hypotension   Migraines    Ovarian cyst    left side   Pre-eclampsia    STD (sexually transmitted disease)    trichomonas(January, 2023 and April, 2023)     No current facility-administered medications on file prior to encounter.   Current Outpatient Medications on File Prior to Encounter  Medication Sig Dispense Refill   Cholecalciferol (VITAMIN D) 50 MCG (2000 UT) CAPS Take 1 capsule by mouth in the morning and at bedtime.     ferrous sulfate 324 MG TBEC Take 324 mg by mouth every other day.     Prenatal Vit-Fe  Fumarate-FA (MULTIVITAMIN-PRENATAL) 27-0.8 MG TABS tablet Take 1 tablet by mouth every other day.       Allergies  Allergen Reactions   Bee Pollen Itching   Toradol [Ketorolac Tromethamine] Hives    History of present pregnancy: Pt Info/Preference:  Screening/Consents:  Labs:   EDD: Estimated Date of Delivery: 09/03/22  Establised: Patient's last menstrual period was 12/06/2021.  Anatomy Scan: Date: 04/09/2022 Placenta Location: anterior Genetic Screen: Panoroma:LR AFP:  First Tri: Quad:  Office: [facility]             First PNV: 10.4 weeks Blood Type --/--/PENDING (06/04 1047)  Language: english Last PNV: 37.4 weeks Rhogam    Flu Vaccine:  declined   Antibody PENDING (06/04 1047)  TDaP vaccine declined   GTT: Early: 5.8 Third Trimester: 74  Feeding Plan: breast BTL: no Rubella: Immune (11/27 0000)  Contraception: ??? VBAC: No RPR: Nonreactive (11/27 0000)   Circumcision: ???   HBsAg: Negative (11/27 0000)  Pediatrician:  ???   HIV: Non-reactive (11/27 0000)   Prenatal Classes: no Additional [place]: yes GBS: Negative/-- (05/23 0000)(For PCN allergy, check sensitivities)       Chlamydia: neg    MFM Referral/Consult:  GC: neg  Support Person: partner  PAP: ???  Pain Management: Epidural vs natural Neonatologist Referral:  Hgb Electrophoresis:  AA  Birth Plan: DCC   Hgb NOB: 12    28W: 10.3   OB History     Gravida  2   Para      Term      Preterm      AB  1   Living         SAB      IAB  1   Ectopic      Multiple      Live Births             Past Medical History:  Diagnosis Date   Abnormal Pap smear of cervix    Asthma    Eczema    Family history of adverse reaction to anesthesia    aunt hypotension   Migraines    Ovarian cyst    left side   Pre-eclampsia    STD (sexually transmitted disease)    trichomonas(January, 2023 and April, 2023)   Past Surgical History:  Procedure Laterality Date   NO PAST SURGERIES     Family History: family history  includes Bone cancer in her mother; Breast cancer in her maternal aunt and maternal aunt; Diabetes in her maternal aunt, maternal aunt, maternal grandmother, and mother; Heart attack in her maternal grandmother; Hypertension in her maternal aunt, maternal aunt, maternal aunt, and maternal grandmother; Ovarian cancer in her maternal aunt; Stroke in her maternal aunt, maternal aunt, and maternal grandmother. Social History:  reports that she has never smoked. She has never used smokeless tobacco. She reports that she does not currently use alcohol after a past usage of about 1.0 standard drink of alcohol per week. She reports that she does not use drugs.   Prenatal Transfer Tool  Maternal Diabetes: No Genetic Screening: Normal Maternal Ultrasounds/Referrals: Normal Fetal Ultrasounds or other Referrals:  None Maternal Substance Abuse:  No Significant Maternal Medications:  None Significant Maternal Lab Results: Group B Strep negative  ROS:  Review of Systems  Constitutional: Negative.   HENT: Negative.    Eyes: Negative.   Respiratory: Negative.    Cardiovascular: Negative.   Gastrointestinal: Negative.   Genitourinary: Negative.   Musculoskeletal: Negative.   Skin: Negative.   Neurological: Negative.   Endo/Heme/Allergies: Negative.   Psychiatric/Behavioral: Negative.       Physical Exam: BP (!) 149/96   Pulse (!) 102   Resp 18   Ht 5' 5.5" (1.664 m)   Wt 118.8 kg   LMP 12/06/2021   BMI 42.94 kg/m   Physical Exam Vitals and nursing note reviewed. Exam conducted with a chaperone present.  Constitutional:      Appearance: Normal appearance.  HENT:     Head: Normocephalic and atraumatic.     Nose: Nose normal.     Mouth/Throat:     Mouth: Mucous membranes are moist.  Eyes:     Pupils: Pupils are equal, round, and reactive to light.  Cardiovascular:     Rate and Rhythm: Normal rate.     Pulses: Normal pulses.     Heart sounds: Normal heart sounds.  Pulmonary:      Effort: Pulmonary effort is normal.     Breath sounds: Normal breath sounds.  Abdominal:     General: Bowel sounds are normal.  Genitourinary:    General: Normal vulva.     Rectum: Normal.     Comments: Pelvis adequate, uterus gravida Musculoskeletal:  General: Normal range of motion.     Cervical back: Normal range of motion and neck supple.  Skin:    General: Skin is warm and dry.     Capillary Refill: Capillary refill takes less than 2 seconds.     Findings: No lesion.  Neurological:     General: No focal deficit present.     Mental Status: She is alert.  Psychiatric:        Mood and Affect: Mood normal.      NST: FHR baseline 130 bpm, Variability: moderate, Accelerations:present, Decelerations:  Absent= Cat 1/Reactive UC:   occ SVE:   Dilation: 1.5 Effacement (%): 80 Station: -2 Exam by:: Troyce Febo CNM, vertex verified by fetal sutures.  Leopold's: Position vertex vias bedside US, EFW 7lbs via leopold's.   Labs: Results for orders placed or performed during the hospital encounter of 08/18/22 (from the past 24 hour(s))  Type and screen [facility]     Status: None (Preliminary result)   Collection Time: 08/18/22 10:47 AM  Result Value Ref Range   ABO/RH(D) PENDING    Antibody Screen PENDING    Sample Expiration      08/21/2022,2359 Performed at [facility], [address] [place], [place] [address]   CBC     Status: Abnormal   Collection Time: 08/18/22 10:56 AM  Result Value Ref Range   WBC 8.1 4.0 - 10.5 K/uL   RBC 3.92 3.87 - 5.11 MIL/uL   Hemoglobin 9.5 (L) 12.0 - 15.0 g/dL   HCT 60.4 (L) 54.0 - 98.1 %   MCV 78.1 (L) 80.0 - 100.0 fL   MCH 24.2 (L) 26.0 - 34.0 pg   MCHC 31.0 30.0 - 36.0 g/dL   RDW 19.1 (H) 47.8 - 29.5 %   Platelets 296 150 - 400 K/uL   nRBC 0.0 0.0 - 0.2 %    Imaging:  [place] MFM OB FOLLOW UP  Result Date: 08/11/2022 ----------------------------------------------------------------------  OBSTETRICS REPORT                        (Signed Final 08/11/2022 04:48 pm) ---------------------------------------------------------------------- Patient Info  ID #:       [id number]                          D.O.B.:  1990/07/02 (31 yrs)  Name:       Patty Coffey                   Visit Date: 08/11/2022 03:28 pm ---------------------------------------------------------------------- Performed By  Attending:        Ma Rings MD         Ref. Address:     [facility]                                                             Gynecology  [address]                                                             [address]                                                             [place], [place]                                                             [address]  Performed By:     Emeline Darling BS,      Location[facility]  Referred By:      Nigel Bridgeman                    CNM ---------------------------------------------------------------------- Orders  #  Description                           Code        Ordered By  1  [place] MFM OB FOLLOW UP                   60454.09    Braxton Feathers ----------------------------------------------------------------------  #  Order #                     Accession #  Episode #  1  [id number]                   [id number]                 [id number] ---------------------------------------------------------------------- Indications  Obesity complicating pregnancy, third          O99.213  trimester(BMI 40)  Encounter for other antenatal screening        Z36.2  follow-up  [redacted] weeks  gestation of pregnancy                Z3A.36 ---------------------------------------------------------------------- Vital Signs  BP:          128/84 ---------------------------------------------------------------------- Fetal Evaluation  Num Of Fetuses:         1  Fetal Heart Rate(bpm):  140  Cardiac Activity:       Observed  Presentation:           Cephalic  Placenta:               Left lateral  P. Cord Insertion:      Not well visualized  Amniotic Fluid  AFI FV:      Within normal limits  AFI Sum(cm)     %Tile       Largest Pocket(cm)  7.44            4.7         3.37  RUQ(cm)       RLQ(cm)       LUQ(cm)        LLQ(cm)  3.37          1.46          1.67           0.94 ---------------------------------------------------------------------- Biometry  BPD:      90.8  mm     G. Age:  36w 6d         66  %    CI:        81.12   %    70 - 86                                                          FL/HC:      24.3   %    20.8 - 22.6  HC:      318.3  mm     G. Age:  35w 6d          9  %    HC/AC:      0.90        0.92 - 1.05  AC:      351.8  mm     G. Age:  39w 1d         98  %    FL/BPD:     85.0   %    71 - 87  FL:       77.2  mm     G. Age:  39w 3d         96  %    FL/AC:      21.9   %    20 - 24  Est. FW:    3505  gm    7 lb 12 oz      92  % ----------------------------------------------------------------------  OB History  Gravidity:    2  TOP:          1 ---------------------------------------------------------------------- Gestational Age  LMP:           35w 3d        Date:  12/06/21                  EDD:   09/12/22  Clinical EDD:  36w 5d                                        EDD:   09/03/22  U/S Today:     37w 6d                                        EDD:   08/26/22  Best:          36w 5d     Det. By:  Clinical EDD             EDD:   09/03/22 ---------------------------------------------------------------------- Anatomy  Cranium:               Appears normal         LVOT:                   Previously seen  Cavum:                  Previously seen        Aortic Arch:            Not well visualized  Ventricles:            Previously seen        Ductal Arch:            Not well visualized  Choroid Plexus:        Previously seen        Diaphragm:              Appears normal  Cerebellum:            Previously seen        Stomach:                Appears normal, left                                                                        sided  Posterior Fossa:       Previously seen        Abdomen:                Appears normal  Nuchal Fold:           Not applicable (>20    Abdominal Wall:         Previously seen                         wks GA)  Face:                  Not well  visualized    Cord Vessels:           Previously seen  Lips:                  Not well visualized    Kidneys:                Appear normal  Palate:                Not well visualized    Bladder:                Appears normal  Thoracic:              Appears normal         Spine:                  Not well visualized  Heart:                 Previously seen        Upper Extremities:      Visualized limited                                                                        prev.  RVOT:                  Previously seen        Lower Extremities:      Visualized limited                                                                        prev.  Other:  3VV and 3VTV previously visualized. Technicallly difficult due to          advanced GA and maternal habitus. Technically difficult due to fetal          position. ---------------------------------------------------------------------- Cervix Uterus Adnexa  Cervix  Not visualized (advanced GA >24wks) ---------------------------------------------------------------------- Comments  Patty Coffey was seen due to possible preeclampsia.  The patient was seen in the MAU due to a nonreactive NST  in the office on Jul 31, 2022.  During her MAU visit, she was  noted to have mildly elevated blood pressures.  She was  discharged home  from the MAU with instructions to collect a  24-hour urine.  The patient reports that her 24-hour urine  showed over 1300 mg of protein.  She reports that her PIH  labs were all within normal limits.  I could not find any  documentation of this in Epic.  Her blood pressure in our office today was 128/84.  She  denies any signs or symptoms of preeclampsia.  She denies any other problems in her current pregnancy.  On today's exam, the overall EFW of 7 pounds 12 ounces  measures at the 92nd percentile for her gestational age.  Normal amniotic fluid with a total AFI of 7.44 cm is noted.  Fetal movements and fetal breathing movements were noted  throughout today's  exam.  The fetus is in the vertex presentation.  The patient was advised that should her 24-hour urine  indicate significant proteinuria, it would mean that she has  developed preeclampsia and therefore delivery is  recommended at between 37 to 38 weeks.  The patient will  discuss scheduling an induction with you during her prenatal  visit later this week.  Preeclampsia precautions were reviewed.  She was advised  to go to the hospital should she experience any signs or  symptoms of preeclampsia.  The patient is happy and comfortable with delivery at  between 37 to 38 weeks.  She stated that all of her questions were answered today.  A total of 20 minutes was spent counseling and coordinating  the care for this patient.  Greater than 50% of the time was  spent in direct face-to-face contact. ----------------------------------------------------------------------                   Ma Rings, MD Electronically Signed Final Report   08/11/2022 04:48 pm ----------------------------------------------------------------------  [place] MFM FETAL BPP WO NON STRESS  Result Date: 07/31/2022 ----------------------------------------------------------------------  OBSTETRICS REPORT                       (Signed Final 07/31/2022 03:56 pm)  ---------------------------------------------------------------------- Patient Info  ID #:       [id number]                          D.O.B.:  01/11/1991 (31 yrs)  Name:       Patty Coffey                   Visit Date: 07/31/2022 02:03 pm ---------------------------------------------------------------------- Performed By  Attending:        Ma Rings MD         Ref. Address:     [facility] &                                                             Gynecology                                                             [address]  [address]                                                             [place], [place]                                                             [address]  Performed By:     Anabel Halon          [facility]  Referred By:      Nigel Bridgeman                    CNM ---------------------------------------------------------------------- Orders  #  Description                           Code        Ordered By  1  [place] MFM FETAL BPP WO NON               60454.09    ERIN LAWRENCE     STRESS ----------------------------------------------------------------------  #  Order #                     Accession #                Episode #  1  [id number]                   [id number]                 [id number] ---------------------------------------------------------------------- Indications  Non-reactive NST                               O28.9  Obesity complicating pregnancy, third          O99.213  trimester  [redacted] weeks gestation of pregnancy                Z3A.35 ---------------------------------------------------------------------- Fetal Evaluation  Num Of Fetuses:         1  Fetal Heart Rate(bpm):  137  Cardiac Activity:        Observed  Presentation:           Cephalic  Placenta:               Left lateral  P. Cord Insertion:      Not well visualized  Amniotic Fluid  AFI FV:      Within normal limits  AFI Sum(cm)     %Tile       Largest Pocket(cm)  11              28  5.7  RUQ(cm)       RLQ(cm)       LUQ(cm)        LLQ(cm)  1.9           0             3.4            5.7 ---------------------------------------------------------------------- Biophysical Evaluation  Amniotic F.V:   Pocket => 2 cm             F. Tone:        Not Observed  F. Movement:    Observed                   N.S.T:          Reactive  F. Breathing:   Observed                   Score:          8/10 ---------------------------------------------------------------------- OB History  Gravidity:    2  TOP:          1 ---------------------------------------------------------------------- Gestational Age  LMP:           33w 6d        Date:  12/06/21                  EDD:   09/12/22  Clinical EDD:  35w 1d                                        EDD:   09/03/22  Best:          35w 1d     Det. By:  Clinical EDD             EDD:   09/03/22 ---------------------------------------------------------------------- Anatomy  Diaphragm:             Appears normal         Kidneys:                Appear normal  Stomach:               Appears normal, left   Bladder:                Appears normal                         sided ---------------------------------------------------------------------- Cervix Uterus Adnexa  Cervix  Not visualized (advanced GA >24wks)  Uterus  No abnormality visualized.  Cul De Sac  No free fluid seen.  Adnexa  No abnormality visualized ---------------------------------------------------------------------- Comments  This patient was seen for a BPP after she had a nonreactive  NST in the office.  Her pregnancy has been complicated by  maternal obesity with a BMI of 43.  A biophysical profile performed today was 6/8.  She received  a -2 for absent fetal tone.   She had a reactive NST in the  MAU, making her total BPP today 8 out of 10.  The AFI was 11. cm (within normal limits).  She should continue weekly fetal testing until delivery. ----------------------------------------------------------------------                   Ma Rings, MD Electronically Signed Final Report   07/31/2022 03:56 pm ----------------------------------------------------------------------  [place] MFM FETAL BPP WO NON STRESS  Result Date:  07/24/2022 ----------------------------------------------------------------------  OBSTETRICS REPORT                       (Signed Final 07/24/2022 02:16 pm) ---------------------------------------------------------------------- Patient Info  ID #:       [id number]                          D.O.B.:  Aug 06, 1990 (31 yrs)  Name:       Patty Coffey                   Visit Date: 07/24/2022 11:36 am ---------------------------------------------------------------------- Performed By  Attending:        Braxton Feathers DO       Ref. Address:     [facility] &                                                             Gynecology                                                             [address]                                                             [address]                                                             [place], [place]                                                             [address]  Performed By:     Anabel Halon          [facility]  Referred By:      Nigel Bridgeman                    CNM ---------------------------------------------------------------------- Orders  #  Description                           Code        Ordered By  1  [place] MFM FETAL BPP WO NON               76819.01    [facility]     STRESS  ----------------------------------------------------------------------  #  Order #                     Accession #                Episode #  1  [id number]                   [id number]                 [id number] ---------------------------------------------------------------------- Indications  Non-reactive NST                               O28.9  [redacted] weeks gestation of pregnancy                Z3A.34  Obesity complicating pregnancy, third          O99.213  trimester ---------------------------------------------------------------------- Fetal Evaluation  Num Of Fetuses:         1  Fetal Heart Rate(bpm):  142  Cardiac Activity:       Observed  Presentation:           Cephalic  Placenta:               Left lateral  P. Cord Insertion:      Not well visualized  Amniotic Fluid  AFI FV:      Subjectively low-normal  AFI Sum(cm)     %Tile       Largest Pocket(cm)  7.4             3.5         3.4  RUQ(cm)       RLQ(cm)       LUQ(cm)        LLQ(cm)  1.1           0             3.4            3 ---------------------------------------------------------------------- Biophysical Evaluation  Amniotic F.V:   Pocket => 2 cm             F. Tone:        Observed  F. Movement:    Observed                   Score:          8/8  F. Breathing:   Observed ---------------------------------------------------------------------- OB History  Gravidity:    2  TOP:          1 ---------------------------------------------------------------------- Gestational Age  LMP:  32w 6d        Date:  12/06/21                  EDD:   09/12/22  Clinical EDD:  34w 1d                                        EDD:   09/03/22  Best:          34w 1d     Det. By:  Clinical EDD             EDD:   09/03/22 ---------------------------------------------------------------------- Anatomy  Diaphragm:             Appears normal         Kidneys:                Appear normal  Stomach:               Appears normal, left   Bladder:                Appears normal                          sided ---------------------------------------------------------------------- Cervix Uterus Adnexa  Cervix  Not visualized (advanced GA >24wks)  Uterus  No abnormality visualized.  Right Ovary  Within normal limits.  Left Ovary  Within normal limits.  Cul De Sac  No free fluid seen.  Adnexa  No abnormality visualized ---------------------------------------------------------------------- Comments  Hospital Ultrasound  34w 1d at the MAU for a nonreactive NST in the office.  MAU  EDD: 09/03/2022 by Clinical EDD.  Sonographic findings  Single intrauterine pregnancy.  Fetal cardiac activity: Observed and appears normal.  Presentation: Cephalic.  Limited fetal anatomy appears normal.  Amniotic fluid volume: Subjectively low-normal. AFI: 7.4 cm.  MVP: 3.4 cm.  Placenta: Left lateral. There is no sonographic evidence of  bleeding.  BPP: 8/8.  Recommendations  - Continue outpatient antenatal testing.  - Continue clinical management per OB provider  This was a limited ultrasound with a remote read. If an official  MFM consult is requested for any reason please call/place an  order in Epic. ----------------------------------------------------------------------                  Braxton Feathers, DO Electronically Signed Final Report   07/24/2022 02:16 pm ----------------------------------------------------------------------   MAU Course: Orders Placed This Encounter  Procedures   CBC   RPR   Comprehensive metabolic panel   Protein / creatinine ratio, urine   Diet clear liquid Room service appropriate? Yes; Fluid consistency: Thin   Vitals signs per unit policy   Notify physician (specify)   Fetal monitoring per unit policy   Activity as tolerated   Cervical Exam   Measure blood pressure post delivery every 15 min x 1 hour then every 30 min x 1 hour   Fundal check post delivery every 15 min x 1 hour then every 30 min x 1 hour   Apply Labor & Delivery Care Plan   If Rapid HIV test positive or known HIV  positive: initiate AZT orders   May in and out cath x 2 for inability to void   Insert urethral catheter X 1 PRN If Coude Catheter is chosen, qualified resources by campus can be found in the clinical skills nursing procedure for [employer]  Catheter 1. If straight catheterized > 2 times or patient unable to void post epidural plac...   Refer to Sidebar Report Urinary (Foley) Catheter Indications   Refer to Sidebar Report Post Indwelling Urinary Catheter Removal and Intervention Guidelines   Discontinue foley prior to vaginal delivery   Initiate Oral Care Protocol   Initiate Carrier Fluid Protocol   Informed Consent Details: Physician/Practitioner Attestation; Transcribe to consent form and obtain patient signature   Evaluate fetal heart rate to establish reassuring pattern prior to initiating Cytotec or Pitocin   Perform a cervical exam prior to initiating Cytotec or Pitocin   Discontinue Pitocin if tachysystole with non-reassuring FHR is present   Notify physician (specify) Tachysystole is defined as more than 5 contractions in a 10-minute time period averaged over a 30-minute window   Initiate intrauterine resuscitation if tachysystole with non-reassuring FHR is present   Notify physician (specify) Tachysystole is defined as more than 5 contractions in a 10-minute time period averaged over a 30-minute window   May administer Terbutaline 0.25 mg SQ x 1 dose if tachysystole with non-reassuring FHR is present   Labor Induction   Patient may have epidural placement upon request   Full code   Nitrous Oxide 50%/Oxygen 50%   Type and screen [facility]   Insert and maintain IV Line   Admit to Inpatient (patient's expected length of stay will be greater than 2 midnights or inpatient only procedure)   Meds ordered this encounter  Medications   lactated ringers infusion   oxytocin (PITOCIN) IV BOLUS FROM BAG   oxytocin (PITOCIN) IV infusion 30 units in NS 500 mL - Premix   lactated  ringers infusion 500-1,000 mL   acetaminophen (TYLENOL) tablet 650 mg   oxyCODONE-acetaminophen (PERCOCET/ROXICET) 5-325 MG per tablet 1 tablet   oxyCODONE-acetaminophen (PERCOCET/ROXICET) 5-325 MG per tablet 2 tablet   ondansetron (ZOFRAN) injection 4 mg   sodium citrate-citric acid (ORACIT) solution 30 mL   lidocaine (PF) (XYLOCAINE) 1 % injection 30 mL   fentaNYL (SUBLIMAZE) injection 50-100 mcg   terbutaline (BRETHINE) injection 0.25 mg   AND Linked Order Group    misoprostol (CYTOTEC) tablet 25 mcg    misoprostol (CYTOTEC) tablet 25 mcg    Assessment/Plan: Deena Shippen is a 32 y.o. female, G2P0010, IUP at 37.5 weeks, presenting for Preeclampsia with elevated BP, creatinine in the office of 1.06, and last night saw spots. Obesity 41 BMI at NOB. Vit D def, and anemia.  Pt endorse + Fm. Denies vaginal leakage. Denies vaginal bleeding. Denies feeling cxt's.   FWB: Cat 1 Fetal Tracing.   Plan: Admit to Birthing Suite per consult with DR Normand Sloop Routine [facility] orders Pain med/epidural prn Cytotec for cervical ripening, 25/25 VA/PO Anticipate labor progression   [facility], FNP-C, PMHNP-BC  [id number]  [place], [place] [address]  Cell: [phone]  Office Phone: [phone] Fax: [phone] 08/18/2022  11:58 AM

## 2022-08-19 ENCOUNTER — Encounter (HOSPITAL_COMMUNITY): Payer: Self-pay | Admitting: Obstetrics and Gynecology

## 2022-08-19 DIAGNOSIS — D62 Acute posthemorrhagic anemia: Secondary | ICD-10-CM | POA: Diagnosis not present

## 2022-08-19 LAB — CBC WITH DIFFERENTIAL/PLATELET
Abs Immature Granulocytes: 0.11 10*3/uL — ABNORMAL HIGH (ref 0.00–0.07)
Basophils Absolute: 0 10*3/uL (ref 0.0–0.1)
Basophils Relative: 0 %
Eosinophils Absolute: 0 10*3/uL (ref 0.0–0.5)
Eosinophils Relative: 0 %
HCT: 21.2 % — ABNORMAL LOW (ref 36.0–46.0)
Hemoglobin: 6.6 g/dL — CL (ref 12.0–15.0)
Immature Granulocytes: 1 %
Lymphocytes Relative: 14 %
Lymphs Abs: 2.4 10*3/uL (ref 0.7–4.0)
MCH: 24.8 pg — ABNORMAL LOW (ref 26.0–34.0)
MCHC: 31.1 g/dL (ref 30.0–36.0)
MCV: 79.7 fL — ABNORMAL LOW (ref 80.0–100.0)
Monocytes Absolute: 1.2 10*3/uL — ABNORMAL HIGH (ref 0.1–1.0)
Monocytes Relative: 7 %
Neutro Abs: 13.3 10*3/uL — ABNORMAL HIGH (ref 1.7–7.7)
Neutrophils Relative %: 78 %
Platelets: 247 10*3/uL (ref 150–400)
RBC: 2.66 MIL/uL — ABNORMAL LOW (ref 3.87–5.11)
RDW: 15.8 % — ABNORMAL HIGH (ref 11.5–15.5)
WBC: 17 10*3/uL — ABNORMAL HIGH (ref 4.0–10.5)
nRBC: 0 % (ref 0.0–0.2)

## 2022-08-19 LAB — BPAM RBC: Unit Type and Rh: 6200

## 2022-08-19 LAB — TYPE AND SCREEN: ABO/RH(D): A POS

## 2022-08-19 LAB — PREPARE RBC (CROSSMATCH)

## 2022-08-19 MED ORDER — WITCH HAZEL-GLYCERIN EX PADS: 1.0000 | MEDICATED_PAD | CUTANEOUS | Status: DC | PRN

## 2022-08-19 MED ORDER — VITAMIN D 25 MCG (1000 UNIT) PO TABS
2000.0000 [IU] | ORAL_TABLET | Freq: Every day | ORAL | Status: DC
  Administered 2022-08-19 – 2022-08-21 (×3): 2000 [IU] via ORAL
  Filled 2022-08-19 (×3): qty 2

## 2022-08-19 MED ORDER — ACETAMINOPHEN 325 MG PO TABS
650.0000 mg | ORAL_TABLET | ORAL | Status: DC | PRN
  Administered 2022-08-20: 650 mg via ORAL
  Administered 2022-08-20: 325 mg via ORAL
  Filled 2022-08-19 (×2): qty 2

## 2022-08-19 MED ORDER — VITAMIN D 50 MCG (2000 UT) PO CAPS: 1.0000 | ORAL_CAPSULE | Freq: Every day | ORAL | Status: DC

## 2022-08-19 MED ORDER — DIBUCAINE (PERIANAL) 1 % EX OINT: 1.0000 | TOPICAL_OINTMENT | CUTANEOUS | Status: DC | PRN

## 2022-08-19 MED ORDER — SODIUM CHLORIDE 0.9% IV SOLUTION: Freq: Once | INTRAVENOUS | Status: AC

## 2022-08-19 MED ORDER — MAGNESIUM SULFATE 40 GM/1000ML IV SOLN
2.0000 g/h | INTRAVENOUS | Status: DC
  Administered 2022-08-19: 2 g/h via INTRAVENOUS
  Filled 2022-08-19 (×2): qty 1000

## 2022-08-19 MED ORDER — BENZOCAINE-MENTHOL 20-0.5 % EX AERO
1.0000 | INHALATION_SPRAY | CUTANEOUS | Status: DC | PRN
  Administered 2022-08-19: 1 via TOPICAL
  Filled 2022-08-19: qty 56

## 2022-08-19 MED ORDER — MISOPROSTOL 200 MCG PO TABS
ORAL_TABLET | ORAL | Status: AC
  Filled 2022-08-19: qty 5

## 2022-08-19 MED ORDER — FUROSEMIDE 10 MG/ML IJ SOLN: 20.0000 mg | Freq: Once | INTRAMUSCULAR | Status: DC

## 2022-08-19 MED ORDER — ONDANSETRON HCL 4 MG/2ML IJ SOLN: 4.0000 mg | INTRAMUSCULAR | Status: DC | PRN

## 2022-08-19 MED ORDER — SENNOSIDES-DOCUSATE SODIUM 8.6-50 MG PO TABS
2.0000 | ORAL_TABLET | Freq: Every day | ORAL | Status: DC
  Administered 2022-08-20 – 2022-08-21 (×2): 2 via ORAL
  Filled 2022-08-19 (×2): qty 2

## 2022-08-19 MED ORDER — LACTATED RINGERS IV SOLN: INTRAVENOUS | Status: AC

## 2022-08-19 MED ORDER — MAGNESIUM SULFATE BOLUS VIA INFUSION
4.0000 g | Freq: Once | INTRAVENOUS | Status: AC
  Administered 2022-08-19: 4 g via INTRAVENOUS
  Filled 2022-08-19: qty 1000

## 2022-08-19 MED ORDER — ACETAMINOPHEN 325 MG PO TABS
650.0000 mg | ORAL_TABLET | Freq: Once | ORAL | Status: AC
  Administered 2022-08-19: 650 mg via ORAL
  Filled 2022-08-19: qty 2

## 2022-08-19 MED ORDER — MISOPROSTOL 200 MCG PO TABS
1000.0000 ug | ORAL_TABLET | ORAL | Status: AC
  Administered 2022-08-19: 1000 ug via RECTAL

## 2022-08-19 MED ORDER — SIMETHICONE 80 MG PO CHEW: 80.0000 mg | CHEWABLE_TABLET | ORAL | Status: DC | PRN

## 2022-08-19 MED ORDER — DIPHENHYDRAMINE HCL 25 MG PO CAPS: 25.0000 mg | ORAL_CAPSULE | Freq: Four times a day (QID) | ORAL | Status: DC | PRN

## 2022-08-19 MED ORDER — PRENATAL MULTIVITAMIN CH
1.0000 | ORAL_TABLET | Freq: Every day | ORAL | Status: DC
  Administered 2022-08-19 – 2022-08-21 (×3): 1 via ORAL
  Filled 2022-08-19 (×3): qty 1

## 2022-08-19 MED ORDER — ONDANSETRON HCL 4 MG PO TABS: 4.0000 mg | ORAL_TABLET | ORAL | Status: DC | PRN

## 2022-08-19 MED ORDER — ZOLPIDEM TARTRATE 5 MG PO TABS: 5.0000 mg | ORAL_TABLET | Freq: Every evening | ORAL | Status: DC | PRN

## 2022-08-19 MED ORDER — OXYCODONE HCL 5 MG PO TABS
5.0000 mg | ORAL_TABLET | ORAL | Status: DC | PRN
  Administered 2022-08-20 – 2022-08-21 (×3): 5 mg via ORAL
  Filled 2022-08-19 (×3): qty 1

## 2022-08-19 MED ORDER — LACTATED RINGERS IV SOLN: INTRAVENOUS | Status: DC

## 2022-08-19 MED ORDER — FUROSEMIDE 10 MG/ML IJ SOLN
20.0000 mg | Freq: Once | INTRAMUSCULAR | Status: AC
  Administered 2022-08-20: 20 mg via INTRAVENOUS
  Filled 2022-08-19: qty 2

## 2022-08-19 MED ORDER — OXYCODONE HCL 5 MG PO TABS: 10.0000 mg | ORAL_TABLET | ORAL | Status: DC | PRN

## 2022-08-19 MED ORDER — DIPHENHYDRAMINE HCL 25 MG PO CAPS
25.0000 mg | ORAL_CAPSULE | Freq: Once | ORAL | Status: AC
  Administered 2022-08-19: 25 mg via ORAL
  Filled 2022-08-19: qty 1

## 2022-08-19 MED ORDER — TETANUS-DIPHTH-ACELL PERTUSSIS 5-2.5-18.5 LF-MCG/0.5 IM SUSY: 0.5000 mL | PREFILLED_SYRINGE | Freq: Once | INTRAMUSCULAR | Status: DC

## 2022-08-19 MED ORDER — COCONUT OIL OIL: 1.0000 | TOPICAL_OIL | Status: DC | PRN

## 2022-08-19 NOTE — Plan of Care (Signed)

## 2022-08-19 NOTE — Progress Notes (Signed)
Post Partum Day 0 Subjective: Patient with lightheadedness and dizziness when she stands up. Vaginal bleeding has been a small amount.    Objective: Blood pressure 131/60, pulse 96, temperature 98.1 F (36.7 C), temperature source Oral, resp. rate 16, height 5' 5.5" (1.664 m), weight 118.8 kg, last menstrual period 12/06/2021, SpO2 100 %, unknown if currently breastfeeding.  Physical Exam:  General: alert, cooperative, and no distress Lochia: appropriate   Recent Labs    08/18/22 1056 08/19/22 1340  HGB 9.5* 6.6*  HCT 30.6* 21.2*    Assessment/Plan: 32 y/o G1P1 PPD # 0 after a vaginal delivery, after IOL for preeclampsia with severe features,  - With symptomatic anemia. - Management options discussed. She desires a blood transfusion.   I discussed with patient risks, benefits and alternatives of blood transfusion including but not limited to risks  of infection (through viruses, bacteria and parasites in donor blood), allergic, hemolytic and other immunologic transfusion reactions.  She expressed understanding of all this and desires to proceed with the transfusion.  Will order 3 units of blood. Check CBC tomorrow AM.  - C/w postpartum magnesium sulfate.      LOS: 1 day   Prescilla Sours, MD 08/19/2022, 4:48 PM

## 2022-08-19 NOTE — Lactation Note (Addendum)
This note was copied from a baby's chart. Lactation Consultation Note  Patient Name: Patty Coffey [id number] Date: 08/19/2022 Age:31 hours  Reason for consult: Initial assessment;Early term 37-38.6wks;1st time breastfeeding;Primapara  P1, [redacted]w[redacted]d, mother on Magnesium Sulfate  Assisted mother with breastfeeding. Mother receptive to [facility] visit. Mother taught hand expression. Drop of colostrum noted. Baby latched shallow with dimpling and tongue clicking. Infant was repositioned, latched deeper on breast and suckled on and off for 5 minutes. Basic breastfeeding education while assisting mother with feeding.   Pediatrician on rounds and observed infant jittery. MD ordered blood sugar.   Started mother pumping for stimulation of milk production. Instructed mother to breastfeed baby with feeding cues and pump 6-8 times/ 24 hours. Feed baby with feeding cues and call for assistance as needed.   Mom made aware of O/P services, breastfeeding support groups, community resources, and our phone # for post-discharge questions.      Maternal Data Has patient been taught Hand Expression?: Yes Does the patient have breastfeeding experience prior to this delivery?: No  Feeding Mother's Current Feeding Choice: Breast Milk  LATCH Score Latch: Repeated attempts needed to sustain latch, nipple held in mouth throughout feeding, stimulation needed to elicit sucking reflex.  Audible Swallowing: None  Type of Nipple: Everted at rest and after stimulation  Comfort (Breast/Nipple): Soft / non-tender  Hold (Positioning): Assistance needed to correctly position infant at breast and maintain latch.  LATCH Score: 6   Lactation Tools Discussed/Used Tools: Pump;Flanges Flange Size: 21 (pt may prefer 24mm for left breast, she will evaluate) Breast pump type: Double-Electric Breast Pump Pump Education: Setup, frequency, and cleaning;Milk Storage Reason for Pumping: early term Pumping frequency: every 3  hours for 15 min  Interventions Interventions: Breast feeding basics reviewed;Assisted with latch;Skin to skin;Hand express;Breast compression;Adjust position;Support pillows;DEBP;Education;LC Services brochure  Discharge Pump: DEBP;Personal  Consult Status Consult Status: Follow-up Date: 08/20/22 Follow-up type: In-patient    Christella Hartigan M 08/19/2022, 1:23 PM

## 2022-08-19 NOTE — Lactation Note (Deleted)
This note was copied from a baby's chart. Lactation Consultation Note  Patient Name: Patty Coffey [id number] Date: 08/19/2022 Age:31 hours Reason for consult: Initial assessment;Early term 37-38.6wks;1st time breastfeeding;Primapara   Maternal Data Has patient been taught Hand Expression?: Yes Does the patient have breastfeeding experience prior to this delivery?: No  Feeding Mother's Current Feeding Choice: Breast Milk  LATCH Score Latch: Repeated attempts needed to sustain latch, nipple held in mouth throughout feeding, stimulation needed to elicit sucking reflex.  Audible Swallowing: None  Type of Nipple: Everted at rest and after stimulation  Comfort (Breast/Nipple): Soft / non-tender  Hold (Positioning): Assistance needed to correctly position infant at breast and maintain latch.  LATCH Score: 6   Lactation Tools Discussed/Used Tools: Pump;Flanges Flange Size: 21 (pt may prefer 24mm for left breast, she will evaluate) Breast pump type: Double-Electric Breast Pump Pump Education: Setup, frequency, and cleaning;Milk Storage Reason for Pumping: early term Pumping frequency: every 3 hours for 15 min  Interventions Interventions: Breast feeding basics reviewed;Assisted with latch;Skin to skin;Hand express;Breast compression;Adjust position;Support pillows;DEBP;Education;LC Services brochure  Discharge Pump: DEBP;Personal  Consult Status Consult Status: Follow-up Date: 08/20/22 Follow-up type: In-patient    Patty Coffey M 08/19/2022, 1:39 PM

## 2022-08-20 ENCOUNTER — Inpatient Hospital Stay (HOSPITAL_COMMUNITY): Payer: 59

## 2022-08-20 LAB — SURGICAL PATHOLOGY

## 2022-08-20 LAB — TYPE AND SCREEN
Unit division: 0
Unit division: 0

## 2022-08-20 LAB — COMPREHENSIVE METABOLIC PANEL
ALT: 15 U/L (ref 0–44)
AST: 25 U/L (ref 15–41)
Albumin: 2.2 g/dL — ABNORMAL LOW (ref 3.5–5.0)
Alkaline Phosphatase: 118 U/L (ref 38–126)
Anion gap: 7 (ref 5–15)
BUN: 8 mg/dL (ref 6–20)
CO2: 23 mmol/L (ref 22–32)
Calcium: 7.4 mg/dL — ABNORMAL LOW (ref 8.9–10.3)
Chloride: 106 mmol/L (ref 98–111)
Creatinine, Ser: 0.9 mg/dL (ref 0.44–1.00)
GFR, Estimated: >60 mL/min (ref >60)
Glucose, Bld: 81 mg/dL (ref 70–99)
Potassium: 4 mmol/L (ref 3.5–5.1)
Sodium: 136 mmol/L (ref 135–145)
Total Bilirubin: 0.4 mg/dL (ref 0.3–1.2)
Total Protein: 5.4 g/dL — ABNORMAL LOW (ref 6.5–8.1)

## 2022-08-20 LAB — CBC
HCT: 28.9 % — ABNORMAL LOW (ref 36.0–46.0)
Hemoglobin: 9.5 g/dL — ABNORMAL LOW (ref 12.0–15.0)
MCH: 25.9 pg — ABNORMAL LOW (ref 26.0–34.0)
MCHC: 32.9 g/dL (ref 30.0–36.0)
MCV: 78.7 fL — ABNORMAL LOW (ref 80.0–100.0)
Platelets: 239 10*3/uL (ref 150–400)
RBC: 3.67 MIL/uL — ABNORMAL LOW (ref 3.87–5.11)
RDW: 15.4 % (ref 11.5–15.5)
WBC: 13.7 10*3/uL — ABNORMAL HIGH (ref 4.0–10.5)
nRBC: 0 % (ref 0.0–0.2)

## 2022-08-20 LAB — BPAM RBC
ISSUE DATE / TIME: 202406052148
ISSUE DATE / TIME: 202406052325
Unit Type and Rh: 6200

## 2022-08-20 LAB — MAGNESIUM: Magnesium: 5.3 mg/dL — ABNORMAL HIGH (ref 1.7–2.4)

## 2022-08-20 MED ORDER — NIFEDIPINE ER OSMOTIC RELEASE 30 MG PO TB24
30.0000 mg | ORAL_TABLET | Freq: Every day | ORAL | Status: DC
  Administered 2022-08-20 – 2022-08-21 (×2): 30 mg via ORAL
  Filled 2022-08-20 (×3): qty 1

## 2022-08-20 NOTE — Progress Notes (Signed)
Post Partum Day 1 Subjective: no complaints, up ad lib, voiding, tolerating PO, and + flatus.  Feels much better since blood transfusion.  Denies HA, visual changes or abdominal pain.  Objective: Blood pressure (!) 141/79, pulse 79, temperature 97.8 F (36.6 C), temperature source Oral, resp. rate 17, height 5' 5.5" (1.664 m), weight 118.8 kg, last menstrual period 12/06/2021, SpO2 100 %, unknown if currently breastfeeding.  Physical Exam:  General: alert and no distress Lochia: appropriate Uterine Fundus: FF, NT Incision: n/a DVT Evaluation: No evidence of DVT seen on physical exam.  Recent Labs    08/19/22 1340 08/20/22 0838  HGB 6.6* 9.5*  HCT 21.2* 28.9*    Assessment/Plan: Doing well s/p 3u PRBCs d/t symptomatic anemia with appropriate rise in Hgb and s/p MgSO4 for preeclampsia. Breastfeeding BP elevated but pt is asymptomatic.  Will start procardia xl 30mg  daily. Continue routine PP care   LOS: 2 days   Purcell Nails, MD 08/20/2022, 2:45 PM

## 2022-08-21 LAB — TYPE AND SCREEN
Antibody Screen: NEGATIVE
Unit division: 0
Unit division: 0

## 2022-08-21 LAB — BPAM RBC
Blood Product Expiration Date: 202406282359
Blood Product Expiration Date: 202406282359
Blood Product Expiration Date: 202406282359
Blood Product Expiration Date: 202406282359
ISSUE DATE / TIME: 202406051715
ISSUE DATE / TIME: 202406060302
Unit Type and Rh: 6200
Unit Type and Rh: 6200

## 2022-08-21 MED ORDER — NIFEDIPINE ER OSMOTIC RELEASE 60 MG PO TB24: 60.0000 mg | ORAL_TABLET | Freq: Every day | ORAL | 2 refills | Status: AC

## 2022-08-21 MED ORDER — ACETAMINOPHEN 325 MG PO TABS: 650.0000 mg | ORAL_TABLET | ORAL | Status: AC | PRN

## 2022-08-21 MED ORDER — NIFEDIPINE ER 30 MG PO TB24: 30.0000 mg | ORAL_TABLET | Freq: Every day | ORAL | 0 refills | Status: DC

## 2022-08-21 MED ORDER — OXYCODONE HCL 5 MG PO TABS: 5.0000 mg | ORAL_TABLET | ORAL | 0 refills | Status: AC | PRN

## 2022-08-21 NOTE — Lactation Note (Signed)
This note was copied from a baby's chart. Lactation Consultation Note  Patient Name: Patty Coffey [id number] Date: 08/21/2022 Age:31 hours Reason for consult: Follow-up assessment;Early term 37-38.6wks;Maternal discharge;Primapara;1st time breastfeeding  Visited with family of 45 hours old ETI female; [name] is a P1 and reports baby has been cluster feeding since 1 pm yesterday. She's been taking her to the breast consistently but also started supplementation with donor/formula when the cluster feeding phase started. She has been pumping but not after every single feeding. Explained the importance of nipple stimulation for the onset of lactogenesis II and to protect her supply. [name] had baby "Patty Coffey" at the breast when entered the room, assisted with latching and positioning (see LATCH score) she reports that feedings at the breast are comfortable. Baby still nursing at the 24 minutes mark when exiting the room. She's getting discharged today. Reviewed discharge education and the importance of pumping after feedings at the breast to protect her supply and for the prevention of engorgement. She politely declined an LC OP referral, but has the contact info in case she needs to schedule an appointment. All questions and concerns answered, family to contact [facility] services PRN.  Feeding Mother's Current Feeding Choice: Breast Milk and Donor Milk  LATCH Score Latch: Repeated attempts needed to sustain latch, nipple held in mouth throughout feeding, stimulation needed to elicit sucking reflex. (baby very eager to latch and also slipped off the breast easily, but needed repositioning every time she became unlatched)  Audible Swallowing: A few with stimulation  Type of Nipple: Everted at rest and after stimulation  Comfort (Breast/Nipple): Soft / non-tender  Hold (Positioning): Assistance needed to correctly position infant at breast and maintain latch.  LATCH Score: 7  Lactation Tools  Discussed/Used Tools: Pump;Flanges Flange Size: 21;24 Breast pump type: Double-Electric Breast Pump Pump Education: Setup, frequency, and cleaning;Milk Storage Reason for Pumping: ETI Pumping frequency: 3 times/24 hours Pumped volume:  (drops)  Interventions Interventions: Breast feeding basics reviewed;Assisted with latch;Breast compression;Adjust position;DEBP;Education  Discharge Discharge Education: Engorgement and breast care;Warning signs for feeding baby;Outpatient recommendation  Consult Status Consult Status: Complete Date: 08/21/22 Follow-up type: Call as needed   Adah Stoneberg Venetia Constable 08/21/2022, 10:54 AM

## 2022-08-21 NOTE — Discharge Summary (Signed)
Postpartum Discharge Summary  Date of Service updated 08/21/22      Patient Name: Patty Coffey DOB: 1990-04-06 MRN: [id number]  Date of admission: 08/18/2022 Delivery date:08/19/2022  Delivering provider: Jaymes Graff  Date of discharge: 08/21/2022  Admitting diagnosis: Preeclampsia [O14.90] Intrauterine pregnancy: [redacted]w[redacted]d     Secondary diagnosis:  Principal Problem:   Preeclampsia Active Problems:   Acute blood loss anemia (ABLA)   SVD (spontaneous vaginal delivery)   Postpartum care following vaginal delivery  Additional problems: none    Discharge diagnosis: Term Pregnancy Delivered, Preeclampsia (mild), and Anemia                                              Post partum procedures:blood transfusion Augmentation: Pitocin and Cytotec Complications: None  Hospital course: Induction of Labor With Vaginal Delivery   32 y.o. yo G2P1011 at [redacted]w[redacted]d was admitted to the hospital 08/18/2022 for induction of labor.  Indication for induction: Preeclampsia.  Patient had an uncomplicated labor course Membrane Rupture Time/Date: 2:46 PM ,08/18/2022   Delivery Method:Vaginal, Spontaneous  Episiotomy: None  Lacerations:  2nd degree;Sulcus  Details of delivery can be found in separate delivery note.  Patient had a postpartum course complicated by symptomatic anemia resolved with transfusion and elevated BP managed by . Patient is discharged home 08/21/22.  Newborn Data: Birth date:08/19/2022  Birth time:5:47 AM  Gender:Female  Living status:Living  Apgars:9 ,9  Weight:3530 g   Magnesium Sulfate received: Yes: Seizure prophylaxis BMZ received: No Rhophylac:N/A MMR:N/A Transfusion:Yes  Physical exam  Vitals:   08/21/22 0025 08/21/22 0515 08/21/22 0530 08/21/22 0834  BP: 129/66 (!) 140/77  (!) 148/64  Pulse: 88 92  94  Resp: 18 16    Temp: 99.4 F (37.4 C) 98.6 F (37 C)    TempSrc: Oral Oral  Oral  SpO2: 100% 100%    Weight:   112.9 kg   Height:       General: alert,  cooperative, and no distress Lochia: appropriate Uterine Fundus: firm Incision: N/A DVT Evaluation: No evidence of DVT seen on physical exam. No cords or calf tenderness. No significant calf/ankle edema. Labs: Lab Results  Component Value Date   WBC 13.7 (H) 08/20/2022   HGB 9.5 (L) 08/20/2022   HCT 28.9 (L) 08/20/2022   MCV 78.7 (L) 08/20/2022   PLT 239 08/20/2022      Latest Ref Rng & Units 08/20/2022    8:38 AM  CMP  Glucose 70 - 99 mg/dL 81   BUN 6 - 20 mg/dL 8   Creatinine 6.29 - 5.28 mg/dL 4.13   Sodium 244 - 010 mmol/L 136   Potassium 3.5 - 5.1 mmol/L 4.0   Chloride 98 - 111 mmol/L 106   CO2 22 - 32 mmol/L 23   Calcium 8.9 - 10.3 mg/dL 7.4   Total Protein 6.5 - 8.1 g/dL 5.4   Total Bilirubin 0.3 - 1.2 mg/dL 0.4   Alkaline Phos 38 - 126 U/L 118   AST 15 - 41 U/L 25   ALT 0 - 44 U/L 15    Edinburgh Score:    08/20/2022    8:30 PM  Edinburgh Postnatal Depression Scale Screening Tool  I have been able to laugh and see the funny side of things. 0  I have looked forward with enjoyment to things. 0  I have blamed myself  unnecessarily when things went wrong. 0  I have been anxious or worried for no good reason. 0  I have felt scared or panicky for no good reason. 0  Things have been getting on top of me. 0  I have been so unhappy that I have had difficulty sleeping. 0  I have felt sad or miserable. 0  I have been so unhappy that I have been crying. 0  The thought of harming myself has occurred to me. 0  Edinburgh Postnatal Depression Scale Total 0      After visit meds:  Allergies as of 08/21/2022       Reactions   Bee Pollen Itching   Toradol [ketorolac Tromethamine] Hives        Medication List     TAKE these medications    acetaminophen 325 MG tablet Commonly known as: Tylenol Take 2 tablets (650 mg total) by mouth every 4 (four) hours as needed (for pain scale < 4).   ferrous sulfate 324 MG Tbec Take 324 mg by mouth every other day.    multivitamin-prenatal 27-0.8 MG Tabs tablet Take 1 tablet by mouth every other day.   NIFEdipine 30 MG 24 hr tablet Commonly known as: ADALAT CC Take 1 tablet (30 mg total) by mouth daily.   oxyCODONE 5 MG immediate release tablet Commonly known as: Oxy IR/ROXICODONE Take 1 tablet (5 mg total) by mouth every 4 (four) hours as needed (pain scale 4-7).   Vitamin D 50 MCG (2000 UT) Caps Take 1 capsule by mouth in the morning and at bedtime.         Discharge home in stable condition Infant Feeding: Breast Infant Disposition:home with mother Discharge instruction: per After Visit Summary and Postpartum booklet. Activity: Advance as tolerated. Pelvic rest for 6 weeks.  Diet: low salt diet Anticipated Birth Control:  Will decide at 6 wk PP appt, aware of options Postpartum Appointment:6 weeks Additional Postpartum F/U: BP check 1 week Future Appointments:No future appointments. Follow up Visit:  Follow-up Information     [facility]. Schedule an appointment as soon as possible for a visit in 1 week(s).   Specialty: Obstetrics and Gynecology Why: Return to [facility] in 1 week for a blood pressure check and then again in 5 weeks for your postpartum visit. Contact information: [address] [place] [address] [phone]                    08/21/2022 Roma Schanz, CNM

## 2022-08-21 NOTE — Plan of Care (Signed)
  Problem: Education: Goal: Knowledge of condition will improve Outcome: Adequate for Discharge Goal: Individualized Educational Video(s) Outcome: Adequate for Discharge Goal: Individualized Newborn Educational Video(s) Outcome: Adequate for Discharge   Problem: Activity: Goal: Will verbalize the importance of balancing activity with adequate rest periods Outcome: Adequate for Discharge Goal: Ability to tolerate increased activity will improve Outcome: Adequate for Discharge   Problem: Coping: Goal: Ability to identify and utilize available resources and services will improve Outcome: Adequate for Discharge   Problem: Life Cycle: Goal: Chance of risk for complications during the postpartum period will decrease Outcome: Adequate for Discharge   Problem: Role Relationship: Goal: Ability to demonstrate positive interaction with newborn will improve Outcome: Adequate for Discharge   Problem: Skin Integrity: Goal: Demonstration of wound healing without infection will improve Outcome: Adequate for Discharge   

## 2022-09-08 ENCOUNTER — Telehealth (HOSPITAL_COMMUNITY): Payer: Self-pay

## 2022-09-08 NOTE — Telephone Encounter (Signed)
09/08/2022 1424  Name: Patty Coffey MRN: [id number] DOB: 12/03/1990  Reason for Call:  Transition of Care Hospital Discharge Call  Contact Status: Patient Contact Status: Message  Language assistant needed: Interpreter Mode: Interpreter Not Needed        Follow-Up Questions:    Inocente Salles Postnatal Depression Scale:  In the Past 7 Days:    PHQ2-9 Depression Scale:     Discharge Follow-up:    Post-discharge interventions: NA  Signature Signe Colt

## 2023-05-03 DIAGNOSIS — L2089 Other atopic dermatitis: Secondary | ICD-10-CM | POA: Diagnosis not present

## 2023-05-03 DIAGNOSIS — L280 Lichen simplex chronicus: Secondary | ICD-10-CM | POA: Diagnosis not present

## 2023-05-03 DIAGNOSIS — Z79899 Other long term (current) drug therapy: Secondary | ICD-10-CM | POA: Diagnosis not present

## 2023-05-03 DIAGNOSIS — L2989 Other pruritus: Secondary | ICD-10-CM | POA: Diagnosis not present
